# Patient Record
Sex: Female | Born: 1965 | Race: Black or African American | Hispanic: No | Marital: Married | Smoking: Never smoker
Health system: Southern US, Community
[De-identification: ages and names within clinical notes are randomized; demographics above are authoritative.]

## PROBLEM LIST (undated history)

## (undated) DIAGNOSIS — E079 Disorder of thyroid, unspecified: Secondary | ICD-10-CM

## (undated) DIAGNOSIS — K8010 Calculus of gallbladder with chronic cholecystitis without obstruction: Secondary | ICD-10-CM

## (undated) DIAGNOSIS — R112 Nausea with vomiting, unspecified: Secondary | ICD-10-CM

## (undated) DIAGNOSIS — J069 Acute upper respiratory infection, unspecified: Secondary | ICD-10-CM

## (undated) DIAGNOSIS — K760 Fatty (change of) liver, not elsewhere classified: Secondary | ICD-10-CM

## (undated) DIAGNOSIS — G4730 Sleep apnea, unspecified: Secondary | ICD-10-CM

## (undated) DIAGNOSIS — I499 Cardiac arrhythmia, unspecified: Secondary | ICD-10-CM

## (undated) DIAGNOSIS — Z98891 History of uterine scar from previous surgery: Secondary | ICD-10-CM

## (undated) DIAGNOSIS — O99280 Endocrine, nutritional and metabolic diseases complicating pregnancy, unspecified trimester: Secondary | ICD-10-CM

## (undated) DIAGNOSIS — O139 Gestational [pregnancy-induced] hypertension without significant proteinuria, unspecified trimester: Secondary | ICD-10-CM

## (undated) DIAGNOSIS — R011 Cardiac murmur, unspecified: Secondary | ICD-10-CM

## (undated) DIAGNOSIS — O24419 Gestational diabetes mellitus in pregnancy, unspecified control: Secondary | ICD-10-CM

## (undated) DIAGNOSIS — E039 Hypothyroidism, unspecified: Secondary | ICD-10-CM

## (undated) DIAGNOSIS — Z9889 Other specified postprocedural states: Secondary | ICD-10-CM

## (undated) DIAGNOSIS — O1490 Unspecified pre-eclampsia, unspecified trimester: Secondary | ICD-10-CM

## (undated) DIAGNOSIS — O09519 Supervision of elderly primigravida, unspecified trimester: Secondary | ICD-10-CM

## (undated) HISTORY — PX: FRACTURE SURGERY: SHX138

## (undated) HISTORY — DX: Disorder of thyroid, unspecified: E07.9

## (undated) HISTORY — PX: BREAST SURGERY: SHX581

## (undated) HISTORY — PX: BREAST EXCISIONAL BIOPSY: SUR124

---

## 1998-05-31 ENCOUNTER — Other Ambulatory Visit: Admission: RE | Admit: 1998-05-31 | Discharge: 1998-05-31 | Payer: Self-pay | Admitting: Internal Medicine

## 1998-09-17 ENCOUNTER — Other Ambulatory Visit: Admission: RE | Admit: 1998-09-17 | Discharge: 1998-09-17 | Payer: Self-pay | Admitting: Obstetrics and Gynecology

## 1999-06-04 ENCOUNTER — Other Ambulatory Visit: Admission: RE | Admit: 1999-06-04 | Discharge: 1999-06-04 | Payer: Self-pay | Admitting: Obstetrics and Gynecology

## 2001-04-14 HISTORY — PX: REDUCTION MAMMAPLASTY: SUR839

## 2003-04-15 HISTORY — PX: OOPHORECTOMY: SHX86

## 2004-01-18 ENCOUNTER — Inpatient Hospital Stay (HOSPITAL_COMMUNITY): Admission: RE | Admit: 2004-01-18 | Discharge: 2004-01-20 | Payer: Self-pay | Admitting: Obstetrics and Gynecology

## 2004-01-18 ENCOUNTER — Encounter (INDEPENDENT_AMBULATORY_CARE_PROVIDER_SITE_OTHER): Payer: Self-pay | Admitting: Specialist

## 2004-01-18 ENCOUNTER — Encounter (INDEPENDENT_AMBULATORY_CARE_PROVIDER_SITE_OTHER): Payer: Self-pay | Admitting: *Deleted

## 2004-04-08 ENCOUNTER — Emergency Department (HOSPITAL_COMMUNITY): Admission: EM | Admit: 2004-04-08 | Discharge: 2004-04-08 | Payer: Self-pay | Admitting: Family Medicine

## 2007-10-05 ENCOUNTER — Encounter: Admission: RE | Admit: 2007-10-05 | Discharge: 2007-10-05 | Payer: Self-pay | Admitting: Obstetrics and Gynecology

## 2010-04-18 ENCOUNTER — Encounter
Admission: RE | Admit: 2010-04-18 | Discharge: 2010-04-18 | Payer: Self-pay | Source: Home / Self Care | Attending: Obstetrics and Gynecology | Admitting: Obstetrics and Gynecology

## 2010-05-04 ENCOUNTER — Encounter: Payer: Self-pay | Admitting: Family Medicine

## 2010-07-12 ENCOUNTER — Ambulatory Visit: Payer: BC Managed Care – PPO | Attending: Specialist | Admitting: Physical Therapy

## 2010-08-30 NOTE — Discharge Summary (Signed)
[name], [name]                 ACCOUNT NO.:  [id number]   MEDICAL RECORD NO.:  [id number]          PATIENT TYPE:  INP   LOCATION:  9315                          FACILITY:  [facility]   PHYSICIAN:  Maxie Better, M.D.DATE OF BIRTH:  1965/05/10   DATE OF ADMISSION:  01/18/2004  DATE OF DISCHARGE:  01/20/2004                                 DISCHARGE SUMMARY   ADMISSION DIAGNOSIS:  Left ovarian mass.   DISCHARGE DIAGNOSIS:  Bilateral dermoid cysts.   PROCEDURE:  Exploratory laparotomy, peritoneal washings, left salpingo-  oophorectomy with frozen section, right ovarian cystectomy.   HISTORY OF PRESENT ILLNESS:  This is a 45 year old G0 married black female  with a 4.1 x 1.6 x 2.2 cm mass contained within the left ovary who presented  for surgical management.   HOSPITAL COURSE:  The patient was admitted to [facility].  She was  taken to the operating room with a general oncologist.  The above procedures  were performed.  Findings at the time of surgery was small uterus, 7 cm  multicystic left adnexal mass with some adhesions to the left pelvic side  wall, a right ovary with 3 cm dermoid cyst and normal retrocecal appendix,  normal liver edge, normal right tube.  The left tube was elongated over the  mass.  The peritoneal washings revealed no malignant cells.  The frozen  section of the left tube and ovary was consistent with a mature cystic  teratoma.  The right ovarian cyst was removed resulting in a mature cystic  teratoma being removed.  Postoperatively, the patient did well.  Her bowels  were slow to recover.  Once she passed flatus, she was to be discharged  home.  Her CBC on postoperative day #1 showed a hemoglobin of 12.8, hematocrit of  37.9, white count of 13.7.  Her incision which is a vertical incision had  staples without any erythema, induration or exudate.  She was tolerating a  regular diet and with flatus.  The patient was discharged home.   DISPOSITION:   Home.   DISCHARGE CONDITION:  Stable.   DISCHARGE INSTRUCTIONS:  Discharge instructions were given to the patient as  follows:  Call for temperature greater than or equal to 100.4.  Nothing per  vagina for 4-6 weeks.  No heavy lifting or driving for two weeks.  Call or  severe abdominal pain, nausea or vomiting, incision or drainage, redness or  increased incisional pain.   FOLLOWUP:  Discharge followup will be staple removal in the office on  January 26, 2004.  Followup otherwise in 4-6 weeks postoperatively.   DISCHARGE MEDICATIONS:  1.  Tylox #30, one to two tablets p.o. q.3-4h. p.r.n. pain.  2.  Motrin 800 mg p.o. q.6-8h. p.r.n. pain.  3.  Continuous use of her Ortho Tri-Cyclen.  4.  Multivitamins.      [place]/MEDQ  D:  02/04/2004  T:  02/05/2004  Job:  [id number]

## 2010-08-30 NOTE — Discharge Summary (Signed)
[name], [name]                 ACCOUNT NO.:  [id number]   MEDICAL RECORD NO.:  [id number]          PATIENT TYPE:  INP   LOCATION:  9315                          [facility]ILITY:  [facility]   PHYSICIAN:  Maxie Better, M.D.DATE OF BIRTH:  1966-01-24   DATE OF ADMISSION:  01/18/2004  DATE OF DISCHARGE:  01/20/2004                                 DISCHARGE SUMMARY   Audio too short to transcribe (less than 5 seconds)      [place]/MEDQ  D:  02/04/2004  T:  02/04/2004  Job:  [id number]

## 2010-08-30 NOTE — H&P (Signed)
[name], CASCIANO                 ACCOUNT NO.:  [id number]   MEDICAL RECORD NO.:  [id number]          PATIENT TYPE:  INP   LOCATION:  NA                            [facility]   PHYSICIAN:  Maxie Better, M.D.DATE OF BIRTH:  1965/11/23   DATE OF ADMISSION:  01/18/2004  DATE OF DISCHARGE:                                HISTORY & PHYSICAL   CHIEF COMPLAINT:  Left ovarian mass.   HISTORY OF PRESENT ILLNESS:  [age]-year-old G0, married black female,  LMP December 28, 2003, with a left ovarian mass, now being admitted for  exploratory laparotomy, left salpingo-oophorectomy with pelvic washings and  staging if indicated.  Patient presented for her routine annual exam in July  2005.  On examination, she was found to have an enlarged irregular uterus.  The patient returned on January 02, 2004 for an ultrasound due to the  abnormal findings on annual examination.  The ultrasound revealed a uterus  that measured 6.2 x 3.9 x 3.3 cm, a 0.3-cm endometrial stripe, a left ovary  with a diffuse appearance and papillary protrusions, the protrusions  measuring 4.1 cm and the ovary measures 6.5 x 4.4 x 7.2 cm; the right ovary  was normal.  The patient, in retrospect, reports having some vague symptoms  of some left-sided pain over the past 3 years.  She reported having a normal  ultrasound in the past in [place]; the results are not known to me.  The  patient otherwise has normal bowel movements.  She has no family history of  ovarian cancer.  She has no associated nausea or vomiting.  The patient is  on birth control pills.  She does not desire children.   PAST MEDICAL HISTORY:  Medical history is negative.   ALLERGIES:  PENICILLIN, ASPIRIN.   MEDICATIONS:  Ortho Tri-Cyclen.   SURGICAL HISTORY:  1.  Breast reduction in 2004.  2.  Wisdom tooth extraction.   GYNECOLOGICAL HISTORY:  Negative.   FAMILY HISTORY:  Maternal grandmother with colon cancer.  Maternal aunt --  breast  cancer.  Father -- MI.  Mother -- diabetes.  No ovarian cancer.   SOCIAL HISTORY:  Married, homemaker, nonsmoker.   REVIEW OF SYSTEMS:  Review of systems positive for fibroadenoma on  mammogram, August 2003, otherwise negative review of systems for all other  areas.   PHYSICAL EXAM:  GENERAL:  Tearful/anxious black female, well-developed, well-  nourished.  VITAL SIGNS:  Blood pressure 124/72, weight 181 pounds, height 5 foot 1  inch.  SKIN:  Skin shows no lesions.  HEENT:  Anicteric sclerae.  Pink conjunctivae.  Oropharynx negative.  NECK:  Neck was supple.  Thyroid not palpable.  NODES:  No supraclavicular, axillary or inguinal nodes palpable.  LUNGS:  Lungs were clear to auscultation.  HEART:  Regular rate and rhythm.  Grade 1/6 systolic ejection murmur.  BACK:  No CVA tenderness.  BREASTS:  Bilateral scars, status post breast reductions, no palpable mass.  ABDOMEN:  Abdomen slightly obese, soft.  No organomegaly.  No palpable mass.  PELVIC EXAM:  Vulva showed no lesions.  Vagina had no discharge.  Cervix  showed no lesion.  Uterus was irregular with a mass contiguous to the left  aspect of the uterus, about 8 cm, non-mobile.  Right adnexa:  No palpable  mass.  EXTREMITIES:  No calf tenderness, no edema.  RECTAL:  Exam deferred.   IMPRESSION:  Left ovarian mass.   PLAN:  Admission, exploratory laparotomy, left salpingo-oophorectomy with  frozen section, possible total abdominal hysterectomy and bilateral salpingo-  oophorectomy, based on frozen section, appropriate surgical staging, based  on the pathological findings, omentectomy, pelvic lymph node dissection if  indicated, antibiotic prophylaxis, antiembolic stockings, routine labs and  tumor markers ordered, full bowel prep on January 17, 2004, procedure  explained in detail.  Discussed staging and grading of tumors.  Patient  asked regarding if tumor is stage I, grade 1, if the finding of ovarian  tumor is noted, whether  or not she would still want to proceed with a total  hysterectomy and bilateral salpingo-oophorectomy, or allow for conservative  management if within the confines of staging and appropriate; the patient  wants to proceed with a total abdominal hysterectomy and bilateral salpingo-  oophorectomy in that setting.  The patient is aware if benign frozen  section, a left salpingo-oophorectomy would be what would be done.  Patient  also advised that sometimes frozen section may differ from the final  pathology; additional surgery may therefore be subsequently necessary.  Additional risk of surgery including infection, bleeding, injury to  surrounding organ structures such as the bladder, bowel or ureter; possible  need for blood transfusion, risks of blood transfusion including HIV  transmission, acute reaction, hepatitis transmission discussed; fistula  formation; bowel obstruction in the future; pelvic pain due to internal scar  tissue discussed; if removal of both ovaries is done, absence of a period,  inability to conceive, menopausal diagnosis and its sequences reviewed with  the patient.  Postoperative management, criteria for discharge home were  discussed.  Use of estrogen replacement therapy discussed with the patient.  All questions answered.  The patient was preoperatively seen by Dr. Pershing Cox in the event that staging for an ovarian neoplasm would be  necessary.      [place]/MEDQ  D:  01/17/2004  T:  01/17/2004  Job:  [id number]

## 2010-08-30 NOTE — Op Note (Signed)
[name], [name]                 ACCOUNT NO.:  [id number]   MEDICAL RECORD NO.:  [id number]          PATIENT TYPE:  INP   LOCATION:  9315                          [facility]   PHYSICIAN:  Maxie Better, M.D.DATE OF BIRTH:  [birth year]   DATE OF PROCEDURE:  01/18/2004  DATE OF DISCHARGE:                                 OPERATIVE REPORT   PREOPERATIVE DIAGNOSES:  Left ovarian mass.   POSTOPERATIVE DIAGNOSES:  Bilateral dermoid cyst pending final pathology.   PROCEDURE:  Examination under anesthesia, exploratory laparotomy, peritoneal  washings, left salpingo-oophorectomy with frozen section, right ovarian  cystectomy.   ANESTHESIA:  General.   SURGEON:  Maxie Better, M.D.   ASSISTANT:  Pershing Cox, M.D.   INDICATIONS FOR PROCEDURE:  This is a 45 year old, G0, married black female  on birth control pills last menstrual period of December 28, 2003 who was  found to have a left ovarian mass on ultrasound done on January 02, 2004.  The mass had papillary projections measuring 4.1 x 1.6 x 2.2 cm with a  diffuse appearance. The right ovary by ultrasound had been normal. The  patient had some vague symptoms of left lower quadrant over the last several  years.  The risks and benefits of the procedure have been explained to the  patient, please see the dictated H&P for the specifics.   DESCRIPTION OF PROCEDURE:  Under adequate general anesthesia, the patient  was placed in the supine position. Examination under anesthesia revealed a  left adnexal mass adherent to the lateral aspect of the uterus. The patient  was sterilely prepped and draped in the usual fashion for a hysterectomy. An  indwelling Foley catheter was placed. A vertical skin incision was made  infraumbilical, carried down to the rectus fascia. The rectus fascia was  split in the midline, extended up superiorly and inferiorly. The rectus  muscles split in the midline, the parietoperitoneum was  entered sharply and  extended.  500 mL of heparinized fluid was infused in the abdomen and  peritoneal washings were then obtained.  The parietoperitoneum was entered  further, the abdomen was then explored, the upper liver edge was palpably  normal, kidneys appeared normal by palpation.  The subdiaphragmatic area was  palpated, no abnormalities noted. The bowels were packed upwardly, the  uterus was noted to be very small. There was a left adnexal mass extended  anteriorly into the broad ligament inferiorly and attached partially to the  left pelvic sidewall. The right ovary  had about a 3 cm cystic mass, the  right tube was normal. The appendix appeared retrocecal but otherwise  normal.  A self retaining Balfour retractor was placed. The left  retroperitoneal space was opened, the left ureter was noted and visualized  with peristalsis seen. The left ovarian vessels were isolated, doubly  clamped, cut and ligated with #0 Vicryl proximally x2 and distally x1.  The  left round ligament suture ligated with #0 Vicryl x2 and severed.  The  anterior leaf of the broad ligament was then opened in order to help  facilitate the removal  of the cystic mass that extended into that area. The  left uteroovarian ligament was clamped, doubly cut with careful dissection.  The left adnexal mass was then subsequently removed in total including the  left tube. This was sent for frozen section.  The left aspect of the uterus  was then suture ligated with #0 Vicryl x2 for good hemostasis.  Small  bleeding in the pelvis on the left was carefully cauterized. Attention was  then turned to the right ovary where the right tube was hyperextended over  the cystic mass.  Using needle point cautery, the antimesenteric border of  the ovary was opened.  Blunt and sharp dissection was then utilized to  enucleate the cyst. The incidental rupture occurred which was consistent  with sebaceous fluid. The cyst was then removed,  the bleeders sutured with 3-  0 Vicryl and then cauterized as well. The remaining portion of the ovarian  tissue was then approximated using 3-0 Vicryl suture.  Good hemostasis was  subsequently noted.  During the cystectomy, the frozen section revealed a  mature teratoma on the left. The procedure was then felt to have been  adequate. The abdomen was then copiously irrigated, suctioned, small  bleeders cauterized. All packings were then subsequently removed. The  Balfour retractor was then removed. The parietoperitoneum was now closed.  The rectus fascia was closed with #0 PDS double loop stitch proximally and  distally. The subcutaneous area was irrigated, small bleeders cauterized and  the skin approximated using Ethibond staples. The specimen was left tube and  ovary and the right ovarian cyst, peritoneal washings.  Estimated blood loss  was 150-200 mL.  Urine output was 200 mL clear yellow urine. Intraoperative  fluid was 2100 mL crystalloid. Sponge and instrument counts x2 was correct.  Complication was none. The patient tolerated the procedure well and was  transferred to recovery in stable condition.      [place]/MEDQ  D:  01/18/2004  T:  01/18/2004  Job:  [id number]

## 2011-05-22 ENCOUNTER — Other Ambulatory Visit: Payer: Self-pay | Admitting: Obstetrics and Gynecology

## 2011-05-22 DIAGNOSIS — Z1231 Encounter for screening mammogram for malignant neoplasm of breast: Secondary | ICD-10-CM

## 2011-06-11 ENCOUNTER — Ambulatory Visit
Admission: RE | Admit: 2011-06-11 | Discharge: 2011-06-11 | Disposition: A | Discharge status: Home / Self Care | Payer: BC Managed Care – PPO | Source: Ambulatory Visit | Attending: Obstetrics and Gynecology | Admitting: Obstetrics and Gynecology

## 2011-06-11 ENCOUNTER — Other Ambulatory Visit: Payer: Self-pay | Admitting: Obstetrics and Gynecology

## 2011-06-11 DIAGNOSIS — Z1231 Encounter for screening mammogram for malignant neoplasm of breast: Secondary | ICD-10-CM

## 2011-06-11 DIAGNOSIS — N63 Unspecified lump in unspecified breast: Secondary | ICD-10-CM

## 2011-06-16 ENCOUNTER — Ambulatory Visit
Admission: RE | Admit: 2011-06-16 | Discharge: 2011-06-16 | Disposition: A | Discharge status: Home / Self Care | Payer: BC Managed Care – PPO | Source: Ambulatory Visit | Attending: Obstetrics and Gynecology | Admitting: Obstetrics and Gynecology

## 2011-06-16 DIAGNOSIS — N63 Unspecified lump in unspecified breast: Secondary | ICD-10-CM

## 2011-09-15 ENCOUNTER — Other Ambulatory Visit: Payer: Self-pay | Admitting: Physician Assistant

## 2011-09-15 DIAGNOSIS — E039 Hypothyroidism, unspecified: Secondary | ICD-10-CM

## 2011-09-17 ENCOUNTER — Ambulatory Visit
Admission: RE | Admit: 2011-09-17 | Discharge: 2011-09-17 | Disposition: A | Discharge status: Home / Self Care | Payer: BC Managed Care – PPO | Source: Ambulatory Visit | Attending: Physician Assistant | Admitting: Physician Assistant

## 2011-09-17 DIAGNOSIS — E039 Hypothyroidism, unspecified: Secondary | ICD-10-CM

## 2012-01-29 LAB — OB RESULTS CONSOLE RPR: RPR: NONREACTIVE

## 2012-01-29 LAB — OB RESULTS CONSOLE HEPATITIS B SURFACE ANTIGEN: Hepatitis B Surface Ag: NEGATIVE

## 2012-01-29 LAB — OB RESULTS CONSOLE HIV ANTIBODY (ROUTINE TESTING): HIV: NONREACTIVE

## 2012-01-29 LAB — OB RESULTS CONSOLE GC/CHLAMYDIA: Gonorrhea: NEGATIVE

## 2012-06-16 ENCOUNTER — Encounter: Payer: BC Managed Care – PPO | Attending: Obstetrics and Gynecology | Admitting: *Deleted

## 2012-06-16 ENCOUNTER — Encounter: Payer: Self-pay | Admitting: *Deleted

## 2012-06-16 DIAGNOSIS — O99810 Abnormal glucose complicating pregnancy: Secondary | ICD-10-CM | POA: Insufficient documentation

## 2012-06-16 DIAGNOSIS — Z713 Dietary counseling and surveillance: Secondary | ICD-10-CM | POA: Insufficient documentation

## 2012-06-16 NOTE — Progress Notes (Signed)
  Patient was seen on 06/16/2012 for Gestational Diabetes self-management class at the [facility]. The following learning objectives were met by the patient during this course:   States the definition of Gestational Diabetes  States why dietary management is important in controlling blood glucose  Describes the effects each nutrient has on blood glucose levels  Demonstrates ability to create a balanced meal plan  Demonstrates carbohydrate counting   States when to check blood glucose levels  Demonstrates proper blood glucose monitoring techniques  States the effect of stress and exercise on blood glucose levels  States the importance of limiting caffeine and abstaining from alcohol and smoking  Blood glucose monitor given: Accu Chek Nano BG Monitoring Kit Lot # [id number] Exp: 08/11/13 Blood glucose reading: 98 mg/dl  Patient instructed to monitor glucose levels: FBS: 60 - <90 2 hour: <120  *Patient received handouts:  Nutrition Diabetes and Pregnancy  Carbohydrate Counting List  Patient will be seen for follow-up as needed.

## 2012-06-16 NOTE — Patient Instructions (Signed)
Goals:  Check glucose levels per MD as instructed  Follow Gestational Diabetes Diet as instructed  Call for follow-up as needed    

## 2012-08-24 ENCOUNTER — Encounter (HOSPITAL_COMMUNITY): Payer: Self-pay | Admitting: *Deleted

## 2012-08-24 ENCOUNTER — Inpatient Hospital Stay (HOSPITAL_COMMUNITY)
Admission: AD | Admit: 2012-08-24 | Discharge: 2012-08-29 | DRG: 651 | Disposition: A | Discharge status: Home / Self Care | Payer: BC Managed Care – PPO | Source: Ambulatory Visit | Attending: Obstetrics and Gynecology | Admitting: Obstetrics and Gynecology

## 2012-08-24 DIAGNOSIS — E669 Obesity, unspecified: Secondary | ICD-10-CM | POA: Diagnosis present

## 2012-08-24 DIAGNOSIS — Z22330 Carrier of Group B streptococcus: Secondary | ICD-10-CM

## 2012-08-24 DIAGNOSIS — O1490 Unspecified pre-eclampsia, unspecified trimester: Secondary | ICD-10-CM

## 2012-08-24 DIAGNOSIS — O99814 Abnormal glucose complicating childbirth: Secondary | ICD-10-CM | POA: Diagnosis present

## 2012-08-24 DIAGNOSIS — O24419 Gestational diabetes mellitus in pregnancy, unspecified control: Secondary | ICD-10-CM

## 2012-08-24 DIAGNOSIS — O99892 Other specified diseases and conditions complicating childbirth: Secondary | ICD-10-CM | POA: Diagnosis present

## 2012-08-24 DIAGNOSIS — O1493 Unspecified pre-eclampsia, third trimester: Secondary | ICD-10-CM

## 2012-08-24 DIAGNOSIS — IMO0002 Reserved for concepts with insufficient information to code with codable children: Principal | ICD-10-CM | POA: Diagnosis present

## 2012-08-24 DIAGNOSIS — E039 Hypothyroidism, unspecified: Secondary | ICD-10-CM

## 2012-08-24 DIAGNOSIS — Z98891 History of uterine scar from previous surgery: Secondary | ICD-10-CM

## 2012-08-24 DIAGNOSIS — O09519 Supervision of elderly primigravida, unspecified trimester: Secondary | ICD-10-CM

## 2012-08-24 DIAGNOSIS — O324XX0 Maternal care for high head at term, not applicable or unspecified: Secondary | ICD-10-CM | POA: Diagnosis present

## 2012-08-24 DIAGNOSIS — E079 Disorder of thyroid, unspecified: Secondary | ICD-10-CM | POA: Diagnosis present

## 2012-08-24 HISTORY — DX: Hypothyroidism, unspecified: E03.9

## 2012-08-24 HISTORY — DX: Nausea with vomiting, unspecified: R11.2

## 2012-08-24 HISTORY — DX: Cardiac arrhythmia, unspecified: I49.9

## 2012-08-24 HISTORY — DX: Acute upper respiratory infection, unspecified: J06.9

## 2012-08-24 HISTORY — DX: Unspecified pre-eclampsia, unspecified trimester: O14.90

## 2012-08-24 HISTORY — DX: Supervision of elderly primigravida, unspecified trimester: O09.519

## 2012-08-24 HISTORY — DX: Gestational diabetes mellitus in pregnancy, unspecified control: O24.419

## 2012-08-24 HISTORY — DX: Sleep apnea, unspecified: G47.30

## 2012-08-24 HISTORY — DX: Gestational (pregnancy-induced) hypertension without significant proteinuria, unspecified trimester: O13.9

## 2012-08-24 HISTORY — DX: Other specified postprocedural states: Z98.890

## 2012-08-24 HISTORY — DX: History of uterine scar from previous surgery: Z98.891

## 2012-08-24 HISTORY — DX: Endocrine, nutritional and metabolic diseases complicating pregnancy, unspecified trimester: O99.280

## 2012-08-24 HISTORY — DX: Morbid (severe) obesity due to excess calories: E66.01

## 2012-08-24 HISTORY — DX: Cardiac murmur, unspecified: R01.1

## 2012-08-24 LAB — COMPREHENSIVE METABOLIC PANEL
ALT: 14 U/L (ref 0–35)
AST: 20 U/L (ref 0–37)
Albumin: 2.6 g/dL — ABNORMAL LOW (ref 3.5–5.2)
BUN: 3 mg/dL — ABNORMAL LOW (ref 6–23)
BUN: <3 mg/dL — ABNORMAL LOW (ref 6–23)
CO2: 22 mEq/L (ref 19–32)
Calcium: 8.8 mg/dL (ref 8.4–10.5)
Calcium: 8.3 mg/dL — ABNORMAL LOW (ref 8.4–10.5)
Chloride: 103 mEq/L (ref 96–112)
Creatinine, Ser: 0.57 mg/dL (ref 0.50–1.10)
Creatinine, Ser: 0.54 mg/dL (ref 0.50–1.10)
GFR calc Af Amer: >90 mL/min (ref >90)
GFR calc non Af Amer: >90 mL/min (ref >90)
GFR calc non Af Amer: >90 mL/min (ref >90)
Glucose, Bld: 77 mg/dL (ref 70–99)
Total Bilirubin: 0.4 mg/dL (ref 0.3–1.2)

## 2012-08-24 LAB — CBC
HCT: 35.8 % — ABNORMAL LOW (ref 36.0–46.0)
HCT: 35.9 % — ABNORMAL LOW (ref 36.0–46.0)
Hemoglobin: 11.9 g/dL — ABNORMAL LOW (ref 12.0–15.0)
MCH: 27.5 pg (ref 26.0–34.0)
MCH: 27.9 pg (ref 26.0–34.0)
MCHC: 33.2 g/dL (ref 30.0–36.0)
MCHC: 34 g/dL (ref 30.0–36.0)
MCV: 82.9 fL (ref 78.0–100.0)
MCV: 82.2 fL (ref 78.0–100.0)
Platelets: 227 10*3/uL (ref 150–400)
RDW: 15.6 % — ABNORMAL HIGH (ref 11.5–15.5)
RDW: 15.6 % — ABNORMAL HIGH (ref 11.5–15.5)
WBC: 10 10*3/uL (ref 4.0–10.5)

## 2012-08-24 LAB — GLUCOSE, CAPILLARY
Glucose-Capillary: 132 mg/dL — ABNORMAL HIGH (ref 70–99)
Glucose-Capillary: 99 mg/dL (ref 70–99)

## 2012-08-24 LAB — URIC ACID
Uric Acid, Serum: 4.3 mg/dL (ref 2.4–7.0)
Uric Acid, Serum: 4.3 mg/dL (ref 2.4–7.0)

## 2012-08-24 LAB — MAGNESIUM: Magnesium: 3.4 mg/dL — ABNORMAL HIGH (ref 1.5–2.5)

## 2012-08-24 MED ORDER — EPHEDRINE 5 MG/ML INJ
10.0000 mg | INTRAVENOUS | Status: DC | PRN
  Filled 2012-08-24: qty 4

## 2012-08-24 MED ORDER — TERBUTALINE SULFATE 1 MG/ML IJ SOLN: 0.2500 mg | Freq: Once | INTRAMUSCULAR | Status: AC | PRN

## 2012-08-24 MED ORDER — LABETALOL HCL 100 MG PO TABS
100.0000 mg | ORAL_TABLET | Freq: Two times a day (BID) | ORAL | Status: DC
  Administered 2012-08-24 – 2012-08-26 (×4): 100 mg via ORAL
  Filled 2012-08-24 (×7): qty 1

## 2012-08-24 MED ORDER — IBUPROFEN 600 MG PO TABS: 600.0000 mg | ORAL_TABLET | Freq: Four times a day (QID) | ORAL | Status: DC | PRN

## 2012-08-24 MED ORDER — PHENYLEPHRINE 40 MCG/ML (10ML) SYRINGE FOR IV PUSH (FOR BLOOD PRESSURE SUPPORT)
80.0000 ug | PREFILLED_SYRINGE | INTRAVENOUS | Status: DC | PRN
  Administered 2012-08-24 (×2): 80 ug via INTRAVENOUS

## 2012-08-24 MED ORDER — OXYCODONE-ACETAMINOPHEN 5-325 MG PO TABS: 1.0000 | ORAL_TABLET | ORAL | Status: DC | PRN

## 2012-08-24 MED ORDER — MAGNESIUM SULFATE BOLUS VIA INFUSION
4.0000 g | Freq: Once | INTRAVENOUS | Status: AC
  Administered 2012-08-24: 4 g via INTRAVENOUS
  Filled 2012-08-24: qty 500

## 2012-08-24 MED ORDER — LIDOCAINE HCL (PF) 1 % IJ SOLN: 30.0000 mL | INTRAMUSCULAR | Status: DC | PRN

## 2012-08-24 MED ORDER — MAGNESIUM SULFATE 40 MG/ML IJ SOLN: 2.0000 g | Freq: Once | INTRAMUSCULAR | Status: DC

## 2012-08-24 MED ORDER — FLEET ENEMA 7-19 GM/118ML RE ENEM: 1.0000 | ENEMA | RECTAL | Status: DC | PRN

## 2012-08-24 MED ORDER — EPHEDRINE 5 MG/ML INJ: 10.0000 mg | INTRAVENOUS | Status: DC | PRN

## 2012-08-24 MED ORDER — LACTATED RINGERS IV SOLN
500.0000 mL | Freq: Once | INTRAVENOUS | Status: AC
  Administered 2012-08-24: 200 mL via INTRAVENOUS

## 2012-08-24 MED ORDER — MAGNESIUM SULFATE BOLUS VIA INFUSION
2.0000 g | Freq: Once | INTRAVENOUS | Status: AC
  Administered 2012-08-24: 2 g via INTRAVENOUS
  Filled 2012-08-24: qty 500

## 2012-08-24 MED ORDER — LACTATED RINGERS IV SOLN
500.0000 mL | INTRAVENOUS | Status: DC | PRN
  Administered 2012-08-24: 250 mL via INTRAVENOUS

## 2012-08-24 MED ORDER — LABETALOL HCL 5 MG/ML IV SOLN
20.0000 mg | Freq: Once | INTRAVENOUS | Status: AC
  Administered 2012-08-24: 20 mg via INTRAVENOUS
  Filled 2012-08-24: qty 4

## 2012-08-24 MED ORDER — FENTANYL 2.5 MCG/ML BUPIVACAINE 1/10 % EPIDURAL INFUSION (WH - ANES)
14.0000 mL/h | INTRAMUSCULAR | Status: DC | PRN
  Administered 2012-08-24: 14 mL/h via EPIDURAL
  Filled 2012-08-24: qty 125

## 2012-08-24 MED ORDER — VANCOMYCIN HCL IN DEXTROSE 1-5 GM/200ML-% IV SOLN
1000.0000 mg | Freq: Two times a day (BID) | INTRAVENOUS | Status: DC
  Administered 2012-08-24 – 2012-08-25 (×2): 1000 mg via INTRAVENOUS
  Filled 2012-08-24 (×3): qty 200

## 2012-08-24 MED ORDER — PHENYLEPHRINE 40 MCG/ML (10ML) SYRINGE FOR IV PUSH (FOR BLOOD PRESSURE SUPPORT)
80.0000 ug | PREFILLED_SYRINGE | INTRAVENOUS | Status: DC | PRN
  Filled 2012-08-24 (×2): qty 5

## 2012-08-24 MED ORDER — DIPHENHYDRAMINE HCL 50 MG/ML IJ SOLN: 12.5000 mg | INTRAMUSCULAR | Status: DC | PRN

## 2012-08-24 MED ORDER — LACTATED RINGERS IV SOLN
INTRAVENOUS | Status: DC
  Administered 2012-08-25 (×2): via INTRAVENOUS

## 2012-08-24 MED ORDER — OXYTOCIN BOLUS FROM INFUSION: 500.0000 mL | INTRAVENOUS | Status: DC

## 2012-08-24 MED ORDER — FLUTICASONE PROPIONATE 50 MCG/ACT NA SUSP
2.0000 | Freq: Every day | NASAL | Status: DC
  Administered 2012-08-24 – 2012-08-29 (×5): 2 via NASAL
  Filled 2012-08-24: qty 16

## 2012-08-24 MED ORDER — CITRIC ACID-SODIUM CITRATE 334-500 MG/5ML PO SOLN
30.0000 mL | ORAL | Status: DC | PRN
  Administered 2012-08-25: 30 mL via ORAL
  Filled 2012-08-24 (×2): qty 15

## 2012-08-24 MED ORDER — OXYTOCIN 40 UNITS IN LACTATED RINGERS INFUSION - SIMPLE MED
1.0000 m[IU]/min | INTRAVENOUS | Status: DC
  Administered 2012-08-24: 2 m[IU]/min via INTRAVENOUS
  Filled 2012-08-24: qty 1000

## 2012-08-24 MED ORDER — LIDOCAINE HCL (PF) 1 % IJ SOLN
INTRAMUSCULAR | Status: DC | PRN
  Administered 2012-08-24 (×4): 4 mL

## 2012-08-24 MED ORDER — ONDANSETRON HCL 4 MG/2ML IJ SOLN
4.0000 mg | Freq: Four times a day (QID) | INTRAMUSCULAR | Status: DC | PRN
  Administered 2012-08-25: 4 mg via INTRAVENOUS
  Filled 2012-08-24: qty 2

## 2012-08-24 MED ORDER — OXYTOCIN 40 UNITS IN LACTATED RINGERS INFUSION - SIMPLE MED: 62.5000 mL/h | INTRAVENOUS | Status: DC

## 2012-08-24 MED ORDER — LACTATED RINGERS IV SOLN
INTRAVENOUS | Status: DC
  Administered 2012-08-24 – 2012-08-25 (×3): via INTRAVENOUS

## 2012-08-24 MED ORDER — ACETAMINOPHEN 325 MG PO TABS
650.0000 mg | ORAL_TABLET | ORAL | Status: DC | PRN
  Administered 2012-08-25: 650 mg via ORAL
  Filled 2012-08-24: qty 2

## 2012-08-24 MED ORDER — MAGNESIUM SULFATE 40 G IN LACTATED RINGERS - SIMPLE
2.0000 g/h | INTRAVENOUS | Status: DC
  Filled 2012-08-24: qty 500

## 2012-08-24 NOTE — Anesthesia Procedure Notes (Signed)
Epidural Patient location during procedure: OB Start time: 08/24/2012 9:50 PM  Staffing Performed by: anesthesiologist   Preanesthetic Checklist Completed: patient identified, site marked, surgical consent, pre-op evaluation, timeout performed, IV checked, risks and benefits discussed and monitors and equipment checked  Epidural Patient position: right lateral decubitus Prep: site prepped and draped and DuraPrep Patient monitoring: continuous pulse ox and blood pressure Approach: midline Injection technique: LOR air  Needle:  Needle type: Tuohy  Needle gauge: 17 G Needle length: 9 cm and 9 Needle insertion depth: 7 cm Catheter type: closed end flexible Catheter size: 19 Gauge Catheter at skin depth: 12 cm Test dose: negative  Assessment Events: blood not aspirated, injection not painful, no injection resistance, negative IV test and no paresthesia  Additional Notes Discussed risk of headache, infection, bleeding, nerve injury and failed or incomplete block.  Patient voices understanding and wishes to proceed.  Dr Cherly Hensen requested epidural be placed with patient in lateral position due to high station of vertex at time of amniotomy.    Epidural placed easily on first attempt.  No paresthesia.  Patient tolerated procedure well with no apparent complications.  Jasmine December, MD   Reason for block:procedure for pain

## 2012-08-24 NOTE — Anesthesia Preprocedure Evaluation (Addendum)
Anesthesia Evaluation  Patient identified by MRN, date of birth, ID band Patient awake    Reviewed: Allergy & Precautions, H&P , NPO status , Patient's Chart, lab work & pertinent test results, reviewed documented beta blocker date and time   History of Anesthesia Complications (+) PONV  Airway Mallampati: II TM Distance: >3 FB Neck ROM: full    Dental  (+) Teeth Intact   Pulmonary sleep apnea and Continuous Positive Airway Pressure Ventilation , Recent URI  (allergies),  breath sounds clear to auscultation        Cardiovascular hypertension (PIH), Rhythm:regular Rate:Normal     Neuro/Psych negative neurological ROS  negative psych ROS   GI/Hepatic Neg liver ROS, GERD-  Medicated,  Endo/Other  diabetes, Gestational, Oral Hypoglycemic AgentsHypothyroidism Morbid obesity  Renal/GU negative Renal ROS  negative genitourinary   Musculoskeletal   Abdominal   Peds  Hematology negative hematology ROS (+)   Anesthesia Other Findings   Reproductive/Obstetrics (+) Pregnancy (failure to progress --> C/S)                          Anesthesia Physical Anesthesia Plan  ASA: III and emergent  Anesthesia Plan: Epidural   Post-op Pain Management:    Induction:   Airway Management Planned:   Additional Equipment:   Intra-op Plan:   Post-operative Plan:   Informed Consent: I have reviewed the patients History and Physical, chart, labs and discussed the procedure including the risks, benefits and alternatives for the proposed anesthesia with the patient or authorized representative who has indicated his/her understanding and acceptance.     Plan Discussed with: Surgeon and CRNA  Anesthesia Plan Comments:        Anesthesia Quick Evaluation

## 2012-08-24 NOTE — H&P (Signed)
[name] is a 47 y.o. female presenting for inductron 2nd to preeclampsia. Pt presented to office for routine [facility] and was found to have BP 188/112, 160/105. Pt notes increased leg swelling. Denies blurry vision or epigastric pain. Pt had 24 hr UTp showing 300mg  protein.  Last PIH labs nl. sono done: BPP 8/8, upper nl AFI, EFW 7lb 8 oz,  History OB History   Grav Para Term Preterm Abortions TAB SAB Ect Mult Living   1              Past Medical History  Diagnosis Date  . Diabetes mellitus without complication   . Thyroid disease   . PONV (postoperative nausea and vomiting)   . Heart murmur   . Hypothyroidism   . Pregnancy induced hypertension   . Sleep apnea   . Dysrhythmia   . Recurrent upper respiratory infection (URI)   . Gestational diabetes    Past Surgical History  Procedure Laterality Date  . Oophorectomy  2005  . Breast surgery    . Fracture surgery      rt great toe-1990   Family History: family history includes Arthritis in her father and mother; Diabetes in her mother; Hyperlipidemia in her father and mother; Hypertension in her mother; and Miscarriages / Stillbirths in her mother. Social History:  reports that she has never smoked. She does not have any smokeless tobacco history on file. She reports that she does not drink alcohol or use illicit drugs.   Prenatal Transfer Tool  Maternal Diabetes: Yes:  Diabetes Type:  Insulin/Medication controlled Genetic Screening: Normal Maternal Ultrasounds/Referrals: Normal Fetal Ultrasounds or other Referrals:  None Maternal Substance Abuse:  No Significant Maternal Medications:  Meds include: Syntroid, glyburide Significant Maternal Lab Results:  Lab values include: Group B Strep positive Other Comments:  hypothyroidism  ROS neg except HPI  Dilation: Closed Effacement (%): 40 Station: -3 Exam by:: Erline Hau RNC  Blood pressure 163/80, pulse 90, resp. rate 20, height 5' 1.5" (1.562 m), weight 106.595 kg (235  lb). Exam Physical Exam  Constitutional: She is oriented to person, place, and time.  Cardiovascular: Normal rate.   GI: Bowel sounds are normal.  Musculoskeletal: She exhibits edema.  Neurological: She is alert and oriented to person, place, and time.  Skin: Skin is warm and dry.  Psychiatric: She has a normal mood and affect.   DTR 2+ (-)clonus. (+) 2 edema Prenatal labs: ABO, Rh: --/--/A POS (05/13 1153) Antibody: NEG (05/13 1153) Rubella:  Immune RPR:   NR HBsAg:   neg HIV:   neg GBS:   Positive  Assessment/Plan: Preeclampsia Class A2 GDM Term gestation hypothyroidism AMA P) admit IV labetalol prn. Intracervical balloon. IV magnesium. PIH labs, BS check q 2 hrs. epidural prn, amniotomy prn. Cont synthroid.IV vancomycin. Pitocin    Suzanna Zahn A 08/24/2012, 3:11 PM

## 2012-08-24 NOTE — Progress Notes (Signed)
S: request epidural Denies ctx   Magnesium Infusion Vancomycin Pitocin 12 MIU  O:  BP 198/96 intracervical balloon removed VE: 1/60/-3 v tx After pt voided 350cc Controlled amniotomy done: copious clear AF noted . IUPC placed  Tracing; baseline 120  (+) variability Ctx q 2-3 mins BS: 132 Magnesium 3.4 Urine output: 350 cc  ImP; Preeclampsia on Magnesium GBS cx (+) on Vancomycin Term Class A2 GDM Hypothyroidism   P) repeat PIH labs now. Cont CBG q 2 hr.  Magnesium 2gram  bolus. Labetalol IV. Start oral labetalol. Restart synthroid in am.

## 2012-08-24 NOTE — Progress Notes (Signed)
Dr. Rodman Pickle notified of pt's decreased BP and fetal heart rate change. Orders given for 250cc bolus and 80 mcg of phenylephrine IVP.

## 2012-08-25 ENCOUNTER — Encounter (HOSPITAL_COMMUNITY): Payer: Self-pay | Admitting: *Deleted

## 2012-08-25 ENCOUNTER — Encounter (HOSPITAL_COMMUNITY): Payer: Self-pay | Admitting: Anesthesiology

## 2012-08-25 ENCOUNTER — Encounter (HOSPITAL_COMMUNITY)
Admission: AD | Disposition: A | Discharge status: Home / Self Care | Payer: Self-pay | Source: Ambulatory Visit | Attending: Obstetrics and Gynecology

## 2012-08-25 ENCOUNTER — Inpatient Hospital Stay (HOSPITAL_COMMUNITY): Payer: BC Managed Care – PPO | Admitting: Anesthesiology

## 2012-08-25 LAB — COMPREHENSIVE METABOLIC PANEL
Albumin: 2.2 g/dL — ABNORMAL LOW (ref 3.5–5.2)
Alkaline Phosphatase: 236 U/L — ABNORMAL HIGH (ref 39–117)
BUN: <3 mg/dL — ABNORMAL LOW (ref 6–23)
CO2: 21 mEq/L (ref 19–32)
Chloride: 105 mEq/L (ref 96–112)
Creatinine, Ser: 0.53 mg/dL (ref 0.50–1.10)
GFR calc Af Amer: >90 mL/min (ref >90)
GFR calc non Af Amer: >90 mL/min (ref >90)
Glucose, Bld: 230 mg/dL — ABNORMAL HIGH (ref 70–99)
Potassium: 3.3 mEq/L — ABNORMAL LOW (ref 3.5–5.1)
Total Bilirubin: 0.7 mg/dL (ref 0.3–1.2)

## 2012-08-25 LAB — CBC
HCT: 31.2 % — ABNORMAL LOW (ref 36.0–46.0)
HCT: 33.8 % — ABNORMAL LOW (ref 36.0–46.0)
Hemoglobin: 10.4 g/dL — ABNORMAL LOW (ref 12.0–15.0)
Hemoglobin: 11.3 g/dL — ABNORMAL LOW (ref 12.0–15.0)
MCV: 82.8 fL (ref 78.0–100.0)
MCV: 82.4 fL (ref 78.0–100.0)
RDW: 15.6 % — ABNORMAL HIGH (ref 11.5–15.5)
RDW: 15.5 % (ref 11.5–15.5)
WBC: 13.2 10*3/uL — ABNORMAL HIGH (ref 4.0–10.5)
WBC: 14.4 10*3/uL — ABNORMAL HIGH (ref 4.0–10.5)

## 2012-08-25 LAB — GLUCOSE, CAPILLARY
Glucose-Capillary: 101 mg/dL — ABNORMAL HIGH (ref 70–99)
Glucose-Capillary: 101 mg/dL — ABNORMAL HIGH (ref 70–99)
Glucose-Capillary: 103 mg/dL — ABNORMAL HIGH (ref 70–99)

## 2012-08-25 LAB — URIC ACID: Uric Acid, Serum: 4.7 mg/dL (ref 2.4–7.0)

## 2012-08-25 SURGERY — Surgical Case
Anesthesia: Epidural | Site: Abdomen | Wound class: Clean Contaminated

## 2012-08-25 MED ORDER — LEVOTHYROXINE SODIUM 75 MCG PO TABS
75.0000 ug | ORAL_TABLET | Freq: Every day | ORAL | Status: DC
  Administered 2012-08-25 – 2012-08-29 (×5): 75 ug via ORAL
  Filled 2012-08-25 (×5): qty 1

## 2012-08-25 MED ORDER — ZOLPIDEM TARTRATE 5 MG PO TABS: 5.0000 mg | ORAL_TABLET | Freq: Every evening | ORAL | Status: DC | PRN

## 2012-08-25 MED ORDER — CARBOPROST TROMETHAMINE 250 MCG/ML IM SOLN
INTRAMUSCULAR | Status: DC | PRN
  Administered 2012-08-25: 250 ug via INTRAMUSCULAR

## 2012-08-25 MED ORDER — PHENYLEPHRINE 40 MCG/ML (10ML) SYRINGE FOR IV PUSH (FOR BLOOD PRESSURE SUPPORT)
PREFILLED_SYRINGE | INTRAVENOUS | Status: AC
  Filled 2012-08-25: qty 5

## 2012-08-25 MED ORDER — ONDANSETRON HCL 4 MG PO TABS
4.0000 mg | ORAL_TABLET | ORAL | Status: DC | PRN
  Administered 2012-08-28: 4 mg via ORAL
  Filled 2012-08-25: qty 1

## 2012-08-25 MED ORDER — ONDANSETRON HCL 4 MG/2ML IJ SOLN: 4.0000 mg | INTRAMUSCULAR | Status: DC | PRN

## 2012-08-25 MED ORDER — BISACODYL 10 MG RE SUPP
10.0000 mg | Freq: Every day | RECTAL | Status: DC | PRN
  Administered 2012-08-27: 10 mg via RECTAL
  Filled 2012-08-25: qty 1

## 2012-08-25 MED ORDER — MAGNESIUM SULFATE 40 G IN LACTATED RINGERS - SIMPLE
2.0000 g/h | INTRAVENOUS | Status: DC
  Administered 2012-08-25 – 2012-08-26 (×2): 2 g/h via INTRAVENOUS
  Filled 2012-08-25 (×2): qty 500

## 2012-08-25 MED ORDER — PRENATAL MULTIVITAMIN CH
1.0000 | ORAL_TABLET | Freq: Every day | ORAL | Status: DC
  Administered 2012-08-26 – 2012-08-29 (×4): 1 via ORAL
  Filled 2012-08-25 (×4): qty 1

## 2012-08-25 MED ORDER — NALOXONE HCL 1 MG/ML IJ SOLN: 1.0000 ug/kg/h | INTRAVENOUS | Status: DC | PRN

## 2012-08-25 MED ORDER — BUPIVACAINE HCL (PF) 0.25 % IJ SOLN
INTRAMUSCULAR | Status: DC | PRN
  Administered 2012-08-25: 10 mL

## 2012-08-25 MED ORDER — CARBOPROST TROMETHAMINE 250 MCG/ML IM SOLN
INTRAMUSCULAR | Status: AC
  Filled 2012-08-25: qty 1

## 2012-08-25 MED ORDER — KCL-LACTATED RINGERS 20 MEQ/L IV SOLN
INTRAVENOUS | Status: DC
  Administered 2012-08-25 – 2012-08-26 (×2): via INTRAVENOUS
  Filled 2012-08-25 (×4): qty 1000

## 2012-08-25 MED ORDER — MEPERIDINE HCL 25 MG/ML IJ SOLN: 6.2500 mg | INTRAMUSCULAR | Status: DC | PRN

## 2012-08-25 MED ORDER — EPHEDRINE SULFATE 50 MG/ML IJ SOLN
INTRAMUSCULAR | Status: DC | PRN
  Administered 2012-08-25 (×2): 5 mg via INTRAVENOUS
  Administered 2012-08-25: 10 mg via INTRAVENOUS

## 2012-08-25 MED ORDER — ONDANSETRON HCL 4 MG/2ML IJ SOLN: 4.0000 mg | Freq: Three times a day (TID) | INTRAMUSCULAR | Status: DC | PRN

## 2012-08-25 MED ORDER — DIBUCAINE 1 % RE OINT: 1.0000 "application " | TOPICAL_OINTMENT | RECTAL | Status: DC | PRN

## 2012-08-25 MED ORDER — METOCLOPRAMIDE HCL 5 MG/ML IJ SOLN
INTRAMUSCULAR | Status: AC
  Filled 2012-08-25: qty 2

## 2012-08-25 MED ORDER — EPHEDRINE 5 MG/ML INJ
INTRAVENOUS | Status: AC
  Filled 2012-08-25: qty 10

## 2012-08-25 MED ORDER — SODIUM CHLORIDE 0.9 % IJ SOLN: 3.0000 mL | Freq: Two times a day (BID) | INTRAMUSCULAR | Status: DC

## 2012-08-25 MED ORDER — ONDANSETRON HCL 4 MG/2ML IJ SOLN
INTRAMUSCULAR | Status: AC
  Filled 2012-08-25: qty 2

## 2012-08-25 MED ORDER — DIPHENHYDRAMINE HCL 25 MG PO CAPS
25.0000 mg | ORAL_CAPSULE | ORAL | Status: DC | PRN
  Administered 2012-08-25 – 2012-08-26 (×2): 25 mg via ORAL
  Filled 2012-08-25 (×2): qty 1

## 2012-08-25 MED ORDER — FERROUS SULFATE 325 (65 FE) MG PO TABS
325.0000 mg | ORAL_TABLET | Freq: Two times a day (BID) | ORAL | Status: DC
  Administered 2012-08-25 – 2012-08-29 (×8): 325 mg via ORAL
  Filled 2012-08-25 (×8): qty 1

## 2012-08-25 MED ORDER — SCOPOLAMINE 1 MG/3DAYS TD PT72: 1.0000 | MEDICATED_PATCH | Freq: Once | TRANSDERMAL | Status: DC

## 2012-08-25 MED ORDER — POLYETHYLENE GLYCOL 3350 17 G PO PACK
17.0000 g | PACK | Freq: Every day | ORAL | Status: DC
  Administered 2012-08-25 – 2012-08-29 (×5): 17 g via ORAL
  Filled 2012-08-25 (×5): qty 1

## 2012-08-25 MED ORDER — ACETAMINOPHEN 10 MG/ML IV SOLN
1000.0000 mg | Freq: Four times a day (QID) | INTRAVENOUS | Status: AC
  Administered 2012-08-25 – 2012-08-26 (×3): 1000 mg via INTRAVENOUS
  Filled 2012-08-25 (×4): qty 100

## 2012-08-25 MED ORDER — NALBUPHINE SYRINGE 5 MG/0.5 ML
INJECTION | INTRAMUSCULAR | Status: AC
  Filled 2012-08-25: qty 1

## 2012-08-25 MED ORDER — MENTHOL 3 MG MT LOZG: 1.0000 | LOZENGE | OROMUCOSAL | Status: DC | PRN

## 2012-08-25 MED ORDER — PHENYLEPHRINE HCL 10 MG/ML IJ SOLN
INTRAMUSCULAR | Status: DC | PRN
  Administered 2012-08-25: 80 ug via INTRAVENOUS
  Administered 2012-08-25 (×3): 40 ug via INTRAVENOUS
  Administered 2012-08-25 (×4): 80 ug via INTRAVENOUS
  Administered 2012-08-25: 40 ug via INTRAVENOUS
  Administered 2012-08-25: 80 ug via INTRAVENOUS

## 2012-08-25 MED ORDER — SIMETHICONE 80 MG PO CHEW
80.0000 mg | CHEWABLE_TABLET | Freq: Three times a day (TID) | ORAL | Status: DC
  Administered 2012-08-25 – 2012-08-29 (×14): 80 mg via ORAL

## 2012-08-25 MED ORDER — NALBUPHINE HCL 10 MG/ML IJ SOLN
5.0000 mg | INTRAMUSCULAR | Status: DC | PRN
  Filled 2012-08-25: qty 1

## 2012-08-25 MED ORDER — SCOPOLAMINE 1 MG/3DAYS TD PT72
MEDICATED_PATCH | TRANSDERMAL | Status: AC
  Administered 2012-08-25: 1.5 mg via TRANSDERMAL
  Filled 2012-08-25: qty 1

## 2012-08-25 MED ORDER — SENNOSIDES-DOCUSATE SODIUM 8.6-50 MG PO TABS
2.0000 | ORAL_TABLET | Freq: Every day | ORAL | Status: DC
  Administered 2012-08-25 – 2012-08-27 (×3): 2 via ORAL

## 2012-08-25 MED ORDER — CLINDAMYCIN PHOSPHATE 900 MG/50ML IV SOLN
900.0000 mg | Freq: Three times a day (TID) | INTRAVENOUS | Status: DC
  Administered 2012-08-25 (×2): 900 mg via INTRAVENOUS
  Filled 2012-08-25 (×2): qty 50

## 2012-08-25 MED ORDER — SIMETHICONE 80 MG PO CHEW
80.0000 mg | CHEWABLE_TABLET | ORAL | Status: DC | PRN
  Administered 2012-08-27: 80 mg via ORAL

## 2012-08-25 MED ORDER — OXYTOCIN 10 UNIT/ML IJ SOLN
40.0000 [IU] | INTRAVENOUS | Status: DC | PRN
  Administered 2012-08-25: 40 [IU] via INTRAVENOUS

## 2012-08-25 MED ORDER — ONDANSETRON HCL 4 MG/2ML IJ SOLN
INTRAMUSCULAR | Status: DC | PRN
  Administered 2012-08-25: 4 mg via INTRAVENOUS

## 2012-08-25 MED ORDER — POTASSIUM CHLORIDE CRYS ER 20 MEQ PO TBCR
40.0000 meq | EXTENDED_RELEASE_TABLET | Freq: Two times a day (BID) | ORAL | Status: DC
  Administered 2012-08-25 – 2012-08-26 (×4): 40 meq via ORAL
  Filled 2012-08-25 (×4): qty 2

## 2012-08-25 MED ORDER — DIPHENHYDRAMINE HCL 25 MG PO CAPS: 25.0000 mg | ORAL_CAPSULE | Freq: Four times a day (QID) | ORAL | Status: DC | PRN

## 2012-08-25 MED ORDER — BUPIVACAINE HCL (PF) 0.25 % IJ SOLN
INTRAMUSCULAR | Status: AC
  Filled 2012-08-25: qty 30

## 2012-08-25 MED ORDER — OXYTOCIN 40 UNITS IN LACTATED RINGERS INFUSION - SIMPLE MED: 62.5000 mL/h | INTRAVENOUS | Status: AC

## 2012-08-25 MED ORDER — WITCH HAZEL-GLYCERIN EX PADS: 1.0000 "application " | MEDICATED_PAD | CUTANEOUS | Status: DC | PRN

## 2012-08-25 MED ORDER — METOCLOPRAMIDE HCL 5 MG/ML IJ SOLN
INTRAMUSCULAR | Status: DC | PRN
  Administered 2012-08-25: 10 mg via INTRAVENOUS

## 2012-08-25 MED ORDER — DIPHENHYDRAMINE HCL 50 MG/ML IJ SOLN: 25.0000 mg | INTRAMUSCULAR | Status: DC | PRN

## 2012-08-25 MED ORDER — FLEET ENEMA 7-19 GM/118ML RE ENEM: 1.0000 | ENEMA | Freq: Every day | RECTAL | Status: DC | PRN

## 2012-08-25 MED ORDER — SODIUM BICARBONATE 8.4 % IV SOLN
INTRAVENOUS | Status: DC | PRN
  Administered 2012-08-25: 5 mL via EPIDURAL

## 2012-08-25 MED ORDER — OXYCODONE-ACETAMINOPHEN 5-325 MG PO TABS
1.0000 | ORAL_TABLET | ORAL | Status: DC | PRN
  Administered 2012-08-26: 2 via ORAL
  Administered 2012-08-26 (×2): 1 via ORAL
  Administered 2012-08-26: 2 via ORAL
  Administered 2012-08-26 – 2012-08-27 (×3): 1 via ORAL
  Administered 2012-08-27 (×2): 2 via ORAL
  Administered 2012-08-27 (×2): 1 via ORAL
  Administered 2012-08-27 – 2012-08-28 (×2): 2 via ORAL
  Administered 2012-08-28: 1 via ORAL
  Administered 2012-08-28: 2 via ORAL
  Administered 2012-08-28 (×2): 1 via ORAL
  Administered 2012-08-29 (×3): 2 via ORAL
  Filled 2012-08-25 (×4): qty 2
  Filled 2012-08-25: qty 1
  Filled 2012-08-25: qty 2
  Filled 2012-08-25: qty 1
  Filled 2012-08-25 (×2): qty 2
  Filled 2012-08-25 (×6): qty 1
  Filled 2012-08-25 (×2): qty 2
  Filled 2012-08-25: qty 1
  Filled 2012-08-25 (×2): qty 2

## 2012-08-25 MED ORDER — SODIUM CHLORIDE 0.9 % IV SOLN: 250.0000 mL | INTRAVENOUS | Status: DC

## 2012-08-25 MED ORDER — DIPHENHYDRAMINE HCL 50 MG/ML IJ SOLN: 12.5000 mg | INTRAMUSCULAR | Status: DC | PRN

## 2012-08-25 MED ORDER — GENTAMICIN SULFATE 40 MG/ML IJ SOLN
170.0000 mg | Freq: Once | INTRAVENOUS | Status: AC
  Administered 2012-08-25: 108 mg via INTRAVENOUS
  Filled 2012-08-25: qty 4.25

## 2012-08-25 MED ORDER — MORPHINE SULFATE (PF) 0.5 MG/ML IJ SOLN
INTRAMUSCULAR | Status: DC | PRN
  Administered 2012-08-25: 3 mg via EPIDURAL

## 2012-08-25 MED ORDER — SODIUM CHLORIDE 0.9 % IJ SOLN: 3.0000 mL | INTRAMUSCULAR | Status: DC | PRN

## 2012-08-25 MED ORDER — OXYTOCIN 10 UNIT/ML IJ SOLN
INTRAMUSCULAR | Status: AC
  Filled 2012-08-25: qty 4

## 2012-08-25 MED ORDER — MORPHINE SULFATE 0.5 MG/ML IJ SOLN
INTRAMUSCULAR | Status: AC
  Filled 2012-08-25: qty 10

## 2012-08-25 MED ORDER — NALBUPHINE HCL 10 MG/ML IJ SOLN
5.0000 mg | INTRAMUSCULAR | Status: DC | PRN
  Administered 2012-08-25: 5 mg via SUBCUTANEOUS
  Filled 2012-08-25: qty 1

## 2012-08-25 MED ORDER — LANOLIN HYDROUS EX OINT: 1.0000 "application " | TOPICAL_OINTMENT | CUTANEOUS | Status: DC | PRN

## 2012-08-25 MED ORDER — NALOXONE HCL 0.4 MG/ML IJ SOLN: 0.4000 mg | INTRAMUSCULAR | Status: DC | PRN

## 2012-08-25 MED ORDER — FENTANYL CITRATE 0.05 MG/ML IJ SOLN: 25.0000 ug | INTRAMUSCULAR | Status: DC | PRN

## 2012-08-25 MED ORDER — METOCLOPRAMIDE HCL 5 MG/ML IJ SOLN: 10.0000 mg | Freq: Three times a day (TID) | INTRAMUSCULAR | Status: DC | PRN

## 2012-08-25 SURGICAL SUPPLY — 42 items
APL SKNCLS STERI-STRIP NONHPOA (GAUZE/BANDAGES/DRESSINGS)
BARRIER ADHS 3X4 INTERCEED (GAUZE/BANDAGES/DRESSINGS) ×4 IMPLANT
BENZOIN TINCTURE PRP APPL 2/3 (GAUZE/BANDAGES/DRESSINGS) IMPLANT
BRR ADH 4X3 ABS CNTRL BYND (GAUZE/BANDAGES/DRESSINGS) ×3
CLOTH BEACON ORANGE TIMEOUT ST (SAFETY) ×2 IMPLANT
CONTAINER PREFILL 10% NBF 15ML (MISCELLANEOUS) IMPLANT
DRAPE LG THREE QUARTER DISP (DRAPES) ×2 IMPLANT
DRSG OPSITE POSTOP 4X10 (GAUZE/BANDAGES/DRESSINGS) ×2 IMPLANT
DURAPREP 26ML APPLICATOR (WOUND CARE) ×2 IMPLANT
ELECT REM PT RETURN 9FT ADLT (ELECTROSURGICAL) ×2
ELECTRODE REM PT RTRN 9FT ADLT (ELECTROSURGICAL) ×1 IMPLANT
EXTRACTOR VACUUM M CUP 4 TUBE (SUCTIONS) IMPLANT
GLOVE BIO SURGEON STRL SZ 6.5 (GLOVE) ×2 IMPLANT
GLOVE BIOGEL PI IND STRL 7.0 (GLOVE) ×1 IMPLANT
GLOVE BIOGEL PI INDICATOR 7.0 (GLOVE) ×1
GOWN STRL REIN XL XLG (GOWN DISPOSABLE) ×4 IMPLANT
KIT ABG SYR 3ML LUER SLIP (SYRINGE) IMPLANT
NDL HYPO 25X1 1.5 SAFETY (NEEDLE) ×1 IMPLANT
NDL HYPO 25X5/8 SAFETYGLIDE (NEEDLE) IMPLANT
NEEDLE HYPO 25X1 1.5 SAFETY (NEEDLE) ×2 IMPLANT
NEEDLE HYPO 25X5/8 SAFETYGLIDE (NEEDLE) IMPLANT
NS IRRIG 1000ML POUR BTL (IV SOLUTION) ×2 IMPLANT
PACK C SECTION WH (CUSTOM PROCEDURE TRAY) ×2 IMPLANT
PAD OB MATERNITY 4.3X12.25 (PERSONAL CARE ITEMS) ×2 IMPLANT
RTRCTR C-SECT PINK 25CM LRG (MISCELLANEOUS) ×1 IMPLANT
STAPLER VISISTAT 35W (STAPLE) IMPLANT
STRIP CLOSURE SKIN 1/2X4 (GAUZE/BANDAGES/DRESSINGS) IMPLANT
SUT CHROMIC GUT AB #0 18 (SUTURE) IMPLANT
SUT MNCRL 0 VIOLET CTX 36 (SUTURE) ×3 IMPLANT
SUT MON AB 4-0 PS1 27 (SUTURE) IMPLANT
SUT MONOCRYL 0 CTX 36 (SUTURE) ×3
SUT PLAIN 2 0 (SUTURE)
SUT PLAIN 2 0 XLH (SUTURE) IMPLANT
SUT PLAIN ABS 2-0 CT1 27XMFL (SUTURE) IMPLANT
SUT VIC AB 0 CT1 27 (SUTURE) ×4
SUT VIC AB 0 CT1 27XBRD ANBCTR (SUTURE) ×2 IMPLANT
SUT VIC AB 2-0 CT1 27 (SUTURE) ×2
SUT VIC AB 2-0 CT1 TAPERPNT 27 (SUTURE) ×1 IMPLANT
SYR CONTROL 10ML LL (SYRINGE) ×2 IMPLANT
TOWEL OR 17X24 6PK STRL BLUE (TOWEL DISPOSABLE) ×6 IMPLANT
TRAY FOLEY CATH 14FR (SET/KITS/TRAYS/PACK) IMPLANT
WATER STERILE IRR 1000ML POUR (IV SOLUTION) ×2 IMPLANT

## 2012-08-25 NOTE — Brief Op Note (Signed)
08/24/2012 - 08/25/2012  7:35 AM  PATIENT:  [name]  47 y.o. female  PRE-OPERATIVE DIAGNOSIS:  Arrest of dilation, Preeclampsia, Class A2 GDM, AMA, morbid obesity  POST-OPERATIVE DIAGNOSIS:  Arrest of dilation, Preeclampsia, Class A2GDM, AMA, morbid obesity  PROCEDURE:  Primary cesarean section, Sharl Ma hysterotomy  SURGEON:  Surgeon(s) and Role:    * Nakia Koble Cathie Beams, MD - Primary  PHYSICIAN ASSISTANT:   ASSISTANTS: none   ANESTHESIA:   epidural Findings: live female LOT, surgical absent left tube and ovary. Bowel adhesion to left side of uterus, right tube and ovary adherent to post serosa, ant placenta. Apgar 9/9 5lb 15 oz  EBL:   600cc I/O:  2200/200 BLOOD ADMINISTERED:none  DRAINS: none   LOCAL MEDICATIONS USED:  MARCAINE     SPECIMEN:  Source of Specimen:  placenta  DISPOSITION OF SPECIMEN:  PATHOLOGY  COUNTS:  YES  TOURNIQUET:  * No tourniquets in log *  DICTATION: .Other Dictation: Dictation Number [id number]  PLAN OF CARE: Admit to inpatient   PATIENT DISPOSITION:  PACU - hemodynamically stable.   Delay start of Pharmacological VTE agent (>24hrs) due to surgical blood loss or risk of bleeding: no

## 2012-08-25 NOTE — Transfer of Care (Signed)
Immediate Anesthesia Transfer of Care Note  Patient: [name]  Procedure(s) Performed: Procedure(s): CESAREAN SECTION (N/A)  Patient Location: PACU  Anesthesia Type:Epidural  Level of Consciousness: awake, alert , oriented and patient cooperative  Airway & Oxygen Therapy: Patient Spontanous Breathing  Post-op Assessment: Report given to PACU RN and Post -op Vital signs reviewed and stable  Post vital signs: stable  Complications: No apparent anesthesia complications

## 2012-08-25 NOTE — Progress Notes (Signed)
S: denies any problem Epidural Pitocin 15 MIU  O: BP 129/55 VE per RN 5/70/-3 Extremity 2+ edema DTR 1+ (-) clonus Tracing reveiwed:  Baseline 120's (+) late decels( repetitive) related to low BP post epidural Ctx q 2 mins Last BS 99 PIH labs done" K 3.2  IMP: Non reassuring tracing related to hypotension( epidural, IV labetalol) resolved with d/c pitocin and Increase  perfusion Preeclampsia on Magnesium(IV). S/P bolus, on labetalol Gbs cx (+) on Vancomycin Class A2GDM AMA Hypothyroidism P)  Pitocin Off. Ephedrine for BP issue. Resume pitocin if ctx spaces. Cont Magnesium . Start potassium

## 2012-08-25 NOTE — Progress Notes (Signed)
S: denies feeling ctx  O: Pitocin BP 173/75 ( 140-198/44-96) VE: 4/70/-3 Extr(+) 2 edema DTR 1+ (-)clonus Tracing cat 1 baseline 125 Ctx q 2- 2 1/2 mins(IUPC)  ImP; Preeclampsia on magnesium Class A2 GDM Hypothyroidism AMA  GBS cx (+) on vancomycin  P) epidural. Cont pitocin. Pt missed synthroid this am. Will add in am. Foley catheter. IV labetalol given. Cont magnesium

## 2012-08-25 NOTE — Anesthesia Postprocedure Evaluation (Signed)
Anesthesia Post Note  Patient: [name]  Procedure(s) Performed: Procedure(s): CESAREAN SECTION (N/A)  Anesthesia type: Spinal  Patient location: Mother Baby  Post pain: Pain level controlled  Post assessment: Post-op Vital signs reviewed  Last Vitals: BP 133/65  Pulse 97  Temp(Src) 36.7 C (Axillary)  Resp 18  Ht 5' 1.5" (1.562 m)  Wt 235 lb (106.595 kg)  BMI 43.69 kg/m2  SpO2 98%  Post vital signs: Reviewed  Level of consciousness: awake  Complications: No apparent anesthesia complications

## 2012-08-25 NOTE — Progress Notes (Signed)
[name] is a 47 y.o. G1P0 at [redacted]w[redacted]d by ultrasound admitted for induction of labor due to Pre-eclamptic toxemia of pregnancy..  Subjective: No chief complaint on file. Pt sleeping Notes some vaginal pressure  Objective: BP 117/68  Pulse 102  Temp(Src) 98.1 F (36.7 C) (Oral)  Resp 18  Ht 5' 1.5" (1.562 m)  Wt 106.595 kg (235 lb)  BMI 43.69 kg/m2  SpO2 99% I/O last 3 completed shifts: In: 1829.2 [P.O.:780; I.V.:849.2; IV Piggyback:200] Out: 950 [Urine:950] Total I/O In: 3293.9 [P.O.:1280; I.V.:1813.9; IV Piggyback:200] Out: 1200 [Urine:1200]  FHT:  FHR: 120's bpm, variability: moderate,  accelerations:  Present,  decelerations:  Absent UC:   regular, every 2 minutes SVE:   5/70/-3 Tracing: cat 1  Labs: Lab Results  Component Value Date   WBC 10.0 08/24/2012   HGB 12.2 08/24/2012   HCT 35.9* 08/24/2012   MCV 82.2 08/24/2012   PLT 227 08/24/2012    Assessment / Plan: Arrest in active phase of labor despite 4 hours of adequate labor Class A2GDM Hypothyroidism AMA Preeclampsia on Magnesium GBS cx(+) on Vancomycin  P) recommend primary C/S. Procedure explained. Risk of surgery reviewed including but not limited to infection, bleeding, injury to surrounding organ structure, internal scar tissue, poss need for blood transfusion. All? Answered. OR notified   Anticipated MOD:  c/s  Tanaja Ganger A 08/25/2012, 6:17 AM

## 2012-08-25 NOTE — Anesthesia Postprocedure Evaluation (Signed)
  Anesthesia Post-op Note  Patient: [name]  Procedure(s) Performed: Procedure(s): CESAREAN SECTION (N/A)  Patient Location: PACU  Anesthesia Type:Epidural  Level of Consciousness: awake, oriented and sedated  Airway and Oxygen Therapy: Patient Spontanous Breathing  Post-op Pain: none  Post-op Assessment: Post-op Vital signs reviewed, Patient's Cardiovascular Status Stable, Respiratory Function Stable, Patent Airway, No signs of Nausea or vomiting, Pain level controlled, No headache and No backache  Post-op Vital Signs: Reviewed and stable  Complications: No apparent anesthesia complications

## 2012-08-25 NOTE — Progress Notes (Signed)
S: comfortable  O: Epidural Pitocin 15 mIU( resumed@ 12:45 am) Magnesium BS 101 BP 134/69 VE: 5/70-80/-3 applied IUPC Tracing: baseline 120's (+) accels mod variability Ctx q 2-4 mins. Adequate MV units 190 since 2:15 am Last PIH labs Nl except low K(3.2)  IMP: Preeclampsia on IV Magnesium, oral labetalol Hypothyroidism Class A2 GDM AMA GBS cx (+) on Vancomycin Hypokalemia on Kdur P) cont increase Pitocin. Cont Kdur. Recheck magnesium level. Synthroid this am. Exaggerated right sims. Reexamine 2 hr . Patient and husband agree with plan

## 2012-08-26 ENCOUNTER — Encounter (HOSPITAL_COMMUNITY): Payer: Self-pay | Admitting: Obstetrics and Gynecology

## 2012-08-26 DIAGNOSIS — O1490 Unspecified pre-eclampsia, unspecified trimester: Secondary | ICD-10-CM

## 2012-08-26 DIAGNOSIS — E039 Hypothyroidism, unspecified: Secondary | ICD-10-CM

## 2012-08-26 DIAGNOSIS — O24419 Gestational diabetes mellitus in pregnancy, unspecified control: Secondary | ICD-10-CM

## 2012-08-26 DIAGNOSIS — O09519 Supervision of elderly primigravida, unspecified trimester: Secondary | ICD-10-CM

## 2012-08-26 DIAGNOSIS — Z98891 History of uterine scar from previous surgery: Secondary | ICD-10-CM

## 2012-08-26 HISTORY — DX: Unspecified pre-eclampsia, unspecified trimester: O14.90

## 2012-08-26 HISTORY — DX: History of uterine scar from previous surgery: Z98.891

## 2012-08-26 HISTORY — DX: Supervision of elderly primigravida, unspecified trimester: O09.519

## 2012-08-26 HISTORY — DX: Hypothyroidism, unspecified: E03.9

## 2012-08-26 HISTORY — DX: Morbid (severe) obesity due to excess calories: E66.01

## 2012-08-26 HISTORY — DX: Gestational diabetes mellitus in pregnancy, unspecified control: O24.419

## 2012-08-26 LAB — COMPREHENSIVE METABOLIC PANEL
ALT: 15 U/L (ref 0–35)
AST: 29 U/L (ref 0–37)
Albumin: 2.3 g/dL — ABNORMAL LOW (ref 3.5–5.2)
Alkaline Phosphatase: 230 U/L — ABNORMAL HIGH (ref 39–117)
BUN: <3 mg/dL — ABNORMAL LOW (ref 6–23)
CO2: 20 mEq/L (ref 19–32)
Calcium: 7.7 mg/dL — ABNORMAL LOW (ref 8.4–10.5)
Chloride: 102 mEq/L (ref 96–112)
Creatinine, Ser: 0.71 mg/dL (ref 0.50–1.10)
GFR calc Af Amer: >90 mL/min (ref >90)
GFR calc non Af Amer: >90 mL/min (ref >90)
Glucose, Bld: 109 mg/dL — ABNORMAL HIGH (ref 70–99)
Potassium: 4.4 mEq/L (ref 3.5–5.1)
Sodium: 134 mEq/L — ABNORMAL LOW (ref 135–145)
Total Bilirubin: 0.3 mg/dL (ref 0.3–1.2)
Total Protein: 5.3 g/dL — ABNORMAL LOW (ref 6.0–8.3)

## 2012-08-26 LAB — CBC
MCHC: 32.8 g/dL (ref 30.0–36.0)
Platelets: 236 10*3/uL (ref 150–400)
RDW: 15.9 % — ABNORMAL HIGH (ref 11.5–15.5)
WBC: 14.9 10*3/uL — ABNORMAL HIGH (ref 4.0–10.5)

## 2012-08-26 MED ORDER — HYDROCHLOROTHIAZIDE 25 MG PO TABS
25.0000 mg | ORAL_TABLET | Freq: Every day | ORAL | Status: DC
  Administered 2012-08-26 – 2012-08-27 (×2): 25 mg via ORAL
  Filled 2012-08-26 (×3): qty 1

## 2012-08-26 MED ORDER — LABETALOL HCL 200 MG PO TABS
200.0000 mg | ORAL_TABLET | Freq: Two times a day (BID) | ORAL | Status: DC
  Administered 2012-08-26 – 2012-08-28 (×5): 200 mg via ORAL
  Filled 2012-08-26 (×6): qty 1

## 2012-08-26 MED ORDER — LACTATED RINGERS IV SOLN
INTRAVENOUS | Status: DC
  Administered 2012-08-26 – 2012-08-27 (×3): via INTRAVENOUS

## 2012-08-26 NOTE — Op Note (Signed)
[name], LACEWELL                 ACCOUNT NO.:  [id number]  MEDICAL RECORD NO.:  [id number]  LOCATION:  9373                          FACILITY:  [facility]  PHYSICIAN:  Maxie Better, M.D.DATE OF BIRTH:  10-18-1965  DATE OF PROCEDURE:  08/25/2012 DATE OF DISCHARGE:                              OPERATIVE REPORT   PREOPERATIVE DIAGNOSES: 1. Arrest of dilatation. 2. Preeclampsia. 3. Class A2 gestational diabetes. 4. Morbid obesity. 5. Intrauterine gestation at 38+ weeks. 6. Hypothyroidism.  PROCEDURE:  Primary cesarean section, Kerr hysterotomy.  POSTOPERATIVE DIAGNOSES: 1. Arrest of dilatation. 2. Preeclampsia. 3. Hyperthyroidism. 4. Class A2 gestational diabetes. 5. Morbid obesity.  ANESTHESIA:  Epidural.  SURGEON:  Maxie Better, M.D.  ASSISTANT:  None.  PROCEDURE IN DETAIL:  Under adequate epidural anesthesia, the patient was placed in the supine position with a left lateral tilt.  She was sterilely prepped and draped in the usual fashion.  An indwelling Foley catheter was already in place draining slightly blood-tinged urine.  The patient was sterilely prepped and draped in the usual fashion.  A Pfannenstiel skin incision was then made, carried down to the rectus fascia with cautery.  Rectus fascia was opened transversely.  Rectus fascia was then bluntly and sharply dissected off the rectus muscle in superior and inferior fashion.  The rectus muscle was split in midline. The parietal peritoneum was entered sharply.  The Alexis retractor was then placed.  The vesicouterine peritoneum was opened transversely.  The bladder was then bluntly dissected off the lower uterine segment, displaced inferiorly.  The curvilinear low transverse uterine incision was then made and extended bluntly.  Subsequent delivery of a live female from the left occiput transverse position was accomplished.  Baby was bulb suctioned.  The abdomen cord was clamped, cut.  The baby  was transferred to the awaiting pediatrician who assigned Apgars of 9 and 9 at 1 and 5 minutes.  The placenta, which was anterior was manually removed.  Uterine cavity was cleaned of debris.  Uterine incision had no extension, was closed in 2 layers, the first layer with 0 Monocryl running locked stitch, second layer was imbricated using 0 Monocryl suture.  The abdomen was then copiously irrigated and suctioned.  The uterus was delivered atonic, and therefore Hemabate was intraoperatively given by the anesthesiologist.  The uterus was exteriorized.  The patient had a prior loss of the left tube and ovary.  There was a bowel adhesion to that left side of the uterus.  On the right, the right ovary was plastered along the posterior aspect of the uterus.  That was lysed off.  The bowels was then carefully lysed off the left side as well. Irrigation suction of that area was done.  Interceed was placed overlying the left side of the area where the bowels was removed.  The uterus was returned to the abdomen carefully.  Interceed was placed over the lower uterine segment.  The parietal peritoneum was then closed with 2-0 Vicryl.  The rectus fascia was closed with 0 Vicryl x2.  The Alexis retractor had been removed.  The subcutaneous area was irrigated, small bleeders cauterized.  Interrupted 2-0 plain sutures placed, and the skin  approximated with Ethicon staples.  SPECIMEN:  Placenta sent to Pathology.  ESTIMATED BLOOD LOSS:  600 mL.  INTRAOPERATIVE FLUID:  2300 mL.  URINE OUTPUT:  50 mL of blood-tinged urine.  Sponge and instrument counts x2 was correct.  COMPLICATION:  None.  The patient tolerated procedure well, was transferred to recovery in stable condition.  The baby was weighed 5 pounds 15 ounces.     Maxie Better, M.D.     [place]/MEDQ  D:  08/25/2012  T:  08/26/2012  Job:  [id number]

## 2012-08-26 NOTE — Progress Notes (Signed)
Pt had 2 unmeasured voids - one this AM when measuring pan was not in toilet & the 2nd when her peripad ended up between her & the measuring pan - blue pad & smaller peripad were saturated with urine.

## 2012-08-26 NOTE — Progress Notes (Signed)
Patient ID: [name], female   DOB: 10/24/1965, 47 y.o.   MRN: [id number] POD # 1  Subjective: Pt reports feeling tired, but well/ Pain controlled with ibuprofen and intra op meds at present Tolerating po/ Foley d/c'ed and pt has voided x 2, with 1 unmeasured void/ No n/v/Flatus neg Denies epigastric pain, HA, blurred vision, CP, SOB Activity: up with assistance Bleeding is light Newborn info:  Information for the patient's newborn:  Jody, Cline [[id number]]  female Feeding: bottle; hx breast reduction and unstable infant glucose   Objective: VS: Blood pressure 137/64, pulse 106, temperature 98.2 F (36.8 C), temperature source Oral, resp. rate 18.     Recent Labs  08/25/12 1200 08/26/12 0601  WBC 14.4* 14.9*  HGB 11.3* 10.5*  HCT 33.8* 32.0*  PLT 207 236   CMP: K: 4.4 Na: 134 Cr Cl:  0.71 Liver enzymes WNL   I & O: Net +635   Blood type: --/--/A POS (05/13 1153) Rubella: Immune (10/17 0000)    Physical Exam:  General: alert, cooperative and no distress CV: Regular rate and rhythm Resp: clear Abdomen: soft, nontender, normal bowel sounds Incision: well approximated, covered with tegaderm and honeycomb dressing; old dry bloody drainage noted Uterine Fundus: firm, below umbilicus, nontender Lochia: minimal Ext: edema +3 pedal and pretib and Homans sign is negative, no sign of DVT    A/P: POD # 1/ G1P1001 S/P C/Section d/t Arrest of Descent, PEC PEC; continue Mag and monitor BP's and output Hx GDM A2, stable; Hypothyroidism, AMA, Morbid Obesity Will update Dr Cherly Hensen Stable at present   Signed: Demetrius Revel, MSN, [facility] 08/26/2012, 10:19 AM

## 2012-08-26 NOTE — Progress Notes (Signed)
Visitors in room.

## 2012-08-26 NOTE — Progress Notes (Signed)
UR chart review completed.  

## 2012-08-27 NOTE — Progress Notes (Signed)
Pt transferred via w/c to [facility] Rm #[id number]. Report updated to [place], [place]

## 2012-08-27 NOTE — Progress Notes (Signed)
Patient ID: [name], female   DOB: 08/22/1965, 47 y.o.   MRN: [id number] POD # 2  Subjective: Pt reports feeling "ok"; sore/ Pain controlled with ibuprofen and percocet Denies HA, epigastric pain, SOB, CP; vague c/o occasional blurred vision Tolerating po/Voiding without problems/ No n/v/Flatus neg Activity: up with assistance Bleeding is light Newborn info:  Information for the patient's newborn:  [name], [name] [[id number]]  female Feeding: breast   Objective: VS: Blood pressure 170/83, pulse 101, temperature 98.6 F (37 C), temperature source Oral, resp. rate 18.   BP range in past 12 hrs:  126 - 170/ 65 - 93  (Most recent) 126/62 Net I&O: +350, however, unmeasured voids x 2 w/ average void 400-649ml   Physical Exam:  General: alert, cooperative and no distress CV: Regular rate and rhythm Resp: clear Abdomen: soft, NT; faint, hypoactive BS x 4 quads Uterine Fundus: firm, below umbilicus, nontender Incision: well approximated and covered with Tegaderm and honeycomb dressing Lochia: minimal Ext: edema +1 to +2 and Homans sign is negative, no sign of DVT    A/P: POD # 2/ G1P1001/ S/P C/Section d/t Arrest of descent PEC with persistent labile BP's this am; Labetalol increased last pm and HCTZ added Hx GDM A2, Morbid obesity, AMA, Hypothyroidism Stable at present will update MD.   Signed: Demetrius Revel, MSN, [facility] 08/27/2012, 9:44 AM

## 2012-08-28 LAB — CBC
HCT: 32.6 % — ABNORMAL LOW (ref 36.0–46.0)
Hemoglobin: 10.9 g/dL — ABNORMAL LOW (ref 12.0–15.0)
MCH: 27.7 pg (ref 26.0–34.0)
MCHC: 33.4 g/dL (ref 30.0–36.0)
MCV: 83 fL (ref 78.0–100.0)
Platelets: 281 10*3/uL (ref 150–400)
RBC: 3.93 MIL/uL (ref 3.87–5.11)
RDW: 15.8 % — ABNORMAL HIGH (ref 11.5–15.5)
WBC: 9.5 10*3/uL (ref 4.0–10.5)

## 2012-08-28 LAB — COMPREHENSIVE METABOLIC PANEL
ALT: 21 U/L (ref 0–35)
AST: 29 U/L (ref 0–37)
Albumin: 2.3 g/dL — ABNORMAL LOW (ref 3.5–5.2)
Alkaline Phosphatase: 212 U/L — ABNORMAL HIGH (ref 39–117)
BUN: 6 mg/dL (ref 6–23)
CO2: 24 mEq/L (ref 19–32)
Calcium: 8.2 mg/dL — ABNORMAL LOW (ref 8.4–10.5)
Chloride: 98 mEq/L (ref 96–112)
Creatinine, Ser: 0.59 mg/dL (ref 0.50–1.10)
GFR calc Af Amer: >90 mL/min (ref >90)
GFR calc non Af Amer: >90 mL/min (ref >90)
Glucose, Bld: 115 mg/dL — ABNORMAL HIGH (ref 70–99)
Potassium: 3.5 mEq/L (ref 3.5–5.1)
Sodium: 133 mEq/L — ABNORMAL LOW (ref 135–145)
Total Bilirubin: 0.5 mg/dL (ref 0.3–1.2)
Total Protein: 5.6 g/dL — ABNORMAL LOW (ref 6.0–8.3)

## 2012-08-28 LAB — URIC ACID: Uric Acid, Serum: 5.9 mg/dL (ref 2.4–7.0)

## 2012-08-28 MED ORDER — HYDROCHLOROTHIAZIDE 50 MG PO TABS
50.0000 mg | ORAL_TABLET | Freq: Every day | ORAL | Status: DC
  Administered 2012-08-28 – 2012-08-29 (×2): 50 mg via ORAL
  Filled 2012-08-28 (×2): qty 1

## 2012-08-28 MED ORDER — MAGNESIUM HYDROXIDE 400 MG/5ML PO SUSP
30.0000 mL | Freq: Every day | ORAL | Status: DC | PRN
  Administered 2012-08-28: 30 mL via ORAL
  Filled 2012-08-28: qty 30

## 2012-08-28 MED ORDER — FLEET ENEMA 7-19 GM/118ML RE ENEM
1.0000 | ENEMA | Freq: Once | RECTAL | Status: AC
  Administered 2012-08-28: 1 via RECTAL

## 2012-08-28 NOTE — Progress Notes (Signed)
Called Dr Seymour Bars, patient states everything she has tried has not worked to have a bowel movement, she feels nauseated, order received.

## 2012-08-28 NOTE — Progress Notes (Signed)
Patient ambulating in hallway, states she feels much better then she did this am.

## 2012-08-28 NOTE — Progress Notes (Signed)
Patient ID: [name], female   DOB: 01-Mar-1966, 47 y.o.   MRN: [id number] POD # 3  Subjective: Pt reports feeling nauseated and bloated/ Pain controlled with percocet (ASA allergy) C/O upper abd tenderness and nausea.  Denies blurred vision, HA, CP, SOB Pt not sure if she feels "gassy" or "constipated", "just uncomfortable Tolerating po/Voiding without problems/ No n/v/Flatus pos Activity: out of bed and ambulate Bleeding is light Newborn info:  Information for the patient's newborn:  Valla, Pacey [[id number]]  female Feeding: bottle   Objective: VS: Blood pressure 180/98, pulse 91, temperature 98.4 F (36.9 C), temperature source Oral, resp. rate 18.   LABS:  Recent Labs  08/25/12 1200 08/26/12 0601  WBC 14.4* 14.9*  HGB 11.3* 10.5*  HCT 33.8* 32.0*  PLT 207 236     Physical Exam:  General: alert, cooperative and no distress CV: Regular rate and rhythm Resp: clear Abdomen: soft, nontender, normal bowel sounds Incision: well approximated Uterine Fundus: firm, below umbilicus, nontender Lochia: minimal Ext: edema +2 to +3 pedal and pretib and Homans sign is negative, no sign of DVT    A/P: POD # 3/ G1P1001/ S/P C/Section d/t Arrest of descent, PEC BP's remain labile and pedal edema increased; will increase HCTZ PIH labs, cont I&O, BPS Q 4 hours, and resume daily weights Ambulate minimum TID Hx GDM A2, hypothyroidism, AMA   Signed: Demetrius Revel, MSN, [facility] 08/28/2012, 10:31 AM

## 2012-08-29 ENCOUNTER — Encounter (HOSPITAL_COMMUNITY)
Admission: RE | Admit: 2012-08-29 | Discharge: 2012-08-29 | Disposition: A | Discharge status: Home / Self Care | Payer: BC Managed Care – PPO | Source: Ambulatory Visit | Attending: Obstetrics and Gynecology | Admitting: Obstetrics and Gynecology

## 2012-08-29 DIAGNOSIS — O923 Agalactia: Secondary | ICD-10-CM | POA: Insufficient documentation

## 2012-08-29 MED ORDER — FERROUS SULFATE 325 (65 FE) MG PO TABS: 325.0000 mg | ORAL_TABLET | Freq: Two times a day (BID) | ORAL | Status: DC

## 2012-08-29 MED ORDER — LABETALOL HCL 300 MG PO TABS: 300.0000 mg | ORAL_TABLET | Freq: Three times a day (TID) | ORAL | Status: DC

## 2012-08-29 MED ORDER — HYDROCHLOROTHIAZIDE 50 MG PO TABS: 50.0000 mg | ORAL_TABLET | Freq: Every day | ORAL | Status: DC

## 2012-08-29 MED ORDER — OXYCODONE-ACETAMINOPHEN 5-325 MG PO TABS: 1.0000 | ORAL_TABLET | ORAL | Status: DC | PRN

## 2012-08-29 MED ORDER — DOCUSATE SODIUM 100 MG PO CAPS: 100.0000 mg | ORAL_CAPSULE | Freq: Three times a day (TID) | ORAL | Status: DC | PRN

## 2012-08-29 MED ORDER — LABETALOL HCL 200 MG PO TABS
200.0000 mg | ORAL_TABLET | Freq: Once | ORAL | Status: AC
  Administered 2012-08-29: 200 mg via ORAL
  Filled 2012-08-29: qty 1

## 2012-08-29 MED ORDER — LABETALOL HCL 300 MG PO TABS
300.0000 mg | ORAL_TABLET | Freq: Two times a day (BID) | ORAL | Status: DC
  Administered 2012-08-29: 300 mg via ORAL
  Filled 2012-08-29: qty 1

## 2012-08-29 NOTE — Progress Notes (Signed)
Dr Seymour Bars called and given report of Patients elevated B/P. Orders given to dose patient with 200 mg Labetalol now and to increase dosage to 300 mg BID with AM dose. To retake B/P in one hour.

## 2012-08-29 NOTE — Progress Notes (Signed)
Patient ID: [name], female   DOB: 08-Jul-1965, 47 y.o.   MRN: [id number] POD # 4  Subjective: Pt reports feeling "much better!!" and eager for d/c home/ Pain controlled with percocet Feelings of upper abdominal pain and "gas pain" markedly improved.  Pos BM after enema and MOM Tolerating po/Voiding without problems/ No n/v/Flatus pos Activity: out of bed and ambulate Bleeding is light Newborn info:  Information for the patient's newborn:  Cherrill, Scrima [[id number]]  female  Feeding: breast   Objective: VS: Blood pressure 153/86, pulse 90, temperature 98 F (36.7 C), temperature source Oral, resp. rate 20   Most recent BP: 141/83  (1 hr after Labetalol 300mg  po) 10 lb wt loss  LABS:  Recent Labs  08/28/12 1056  WBC 9.5  HGB 10.9*  HCT 32.6*  PLT 281     Physical Exam:  General: alert, cooperative and no distress CV: Regular rate and rhythm Resp: clear Abdomen: soft, nontender, normal bowel sounds Incision: well approximated Uterine Fundus: firm, below umbilicus, nontender Lochia: minimal Ext: edema +2 pedal and pretib and Homans sign is negative, no sign of DVT    A/P: POD # 4/ G1P1001/ S/P Primary C/Section d/t failure to progress and PEC PEC; BP's remain labile but improving.  Will recheck in the office in 3 to 4 days Hx GDM A1, Hypothyroidism, Morbid Obesity Tegaderm dressing removed; will not replace Stable for discharge home RX's: Labetalol 300mg  po TID HCTZ 50mg  po QD x 5 days Percocet 5/325 1 - 2 tabs po every 4 hrs prn pain  #40 No refill Colace 200 to 300mg  /day prn #60 Ref x 1  Staple removal in the office with BP recheck     Signed: Demetrius Revel, MSN, [facility] 08/29/2012, 10:45 AM

## 2012-08-31 NOTE — Discharge Summary (Signed)
Obstetric Discharge Summary Reason for Admission: G1 P0 @ 28 wks for IOL due to PEC    BP in office 188/112, PIH labs WNL.  BPP 8/8 and upper normal AFI    Hx GDM A2, Hypothyroidism, Morbid obesity, AMA    GBS Pos Prenatal Procedures: NST and ultrasound Intrapartum Procedures: Primary cesarean section, magnesium sulfate Postpartum Procedures: antibiotics Complications-Operative and Postpartum: PEC, labile BP's. Hemoglobin  Date Value Range Status  08/28/2012 10.9* 12.0 - 15.0 g/dL Final     HCT  Date Value Range Status  08/28/2012 32.6* 36.0 - 46.0 % Final    Physical Exam:  General: alert, cooperative and no distress Lochia: appropriate Uterine Fundus: firm Incision: Well approximated and closed with staples DVT Evaluation: No evidence of DVT seen on physical exam. Negative Homan's sign. Calf/Ankle edema is present.  Discharge Diagnoses: G1 P1, s/p primary c/s due to Arrest of Descent and PEC.  Persistent labile BP's.  Will continue antihypertensive and HCTZ until reevaluation in the office later this week.  Discharge Information: Date: 08/31/2012 Activity: pelvic rest Diet: routine Medications: PNV, Ibuprofen, Colace, Percocet and labetalol and HCTZ Condition: stable Instructions: refer to practice specific booklet Discharge to: home Follow-up Information   Follow up with Odessie Polzin A, MD In 6 weeks. (BP check and staple removal in the office 5/21 or 5/22)    Contact information:   [address] [place] [facility] [address] [phone]       Newborn Data: Live born female on 08/[birth year]/12 Birth Weight: 5 lb 15 oz (2693 g) APGAR: 9, 9  Home with mother.  [name],[name] 08/31/2012, 10:42 AM

## 2012-09-03 ENCOUNTER — Ambulatory Visit: Payer: Self-pay

## 2012-09-03 NOTE — Lactation Note (Signed)
This note was copied from the chart of [name]. Adult Lactation Consultation Outpatient Visit Note Follow up for history of breast reduction surgery 9 years ago  Patient Name: [name]      Mother's name Zayne Marovich  DOB: 11/17/[birth year]66        Date of Birth: 08/25/2011 Gestational Age at Delivery: 38 3/7 weeks  Birth weight: 5 lbs 15 oz  Today's weight: 6 lbs. 49.91 oz (3 days old) Type of Delivery: Cesarean Section    Supplementing / Method: Pumping:  Type of Pump: [employer] pump   Frequency: 3 times a day  Volume:  11-14 ml per pumping Supplementing: 59 ml every 3-4 hrs formula adding breast milk when available, by slow flow bottle  Comments: Mom comes in today for an OP Lactation Consultation.  Mom rented a DEBP from hospital, and recently has been pumping 3 times a day and obtaining 11-14 ml.  Baby is primarily receiving formula by bottle every 3-4 hrs, adding breast milk as able.  Mom states baby had been breast feeding between feedings, but baby not latching on deeply.  Mom reports putting baby to breast often, for 15-60 mins.  She lets baby sleep at the breast often.  Talked about trying an SNS at the breast, to increase baby's nutritive sucking/swallowing at the breast which is stimulating for her milk supply.  Mom agreed to try in office.   Consultation Evaluation:  Initial Feeding Assessment: Pre-feed Weight: 2832 gm Post-feed Weight: 2868 gm Amount Transferred: 36 ml  Comments:  Right breast, with 60 ml formula in SNS, baby took about 35 ml from SNS.  Baby initially latching onto nipple, sucking and dimpling in with her cheeks.  She was making sucking, smacking noises at the breast, which Mom states that is what she had been doing at home.  With assistance, baby was able to latch deeply onto areola, using her jaw and compressing the breast.  Mom states she felt a difference, more of a tug.  Baby stayed on for 20 minutes.  Mom agreeable to use the SNS periodically  throughout the day.  Mom states baby had half a feeding an hour prior to the appointment, and we had to awaken her for this feeding.  Talked about growth spurts, and how babies will cue to feed more often that every 4 hrs.  Mom and Dad were trying to get baby on a schedule.  Encouraged them to watch for cues, and feed Meagan when she is hungry, even if it is an hour after last feeding.  They were encouraged to wake her if she has been sleeping 4 hrs, but expect to feed her every 2-3 hrs, 60 ml, and increasing as tolerated.  Encouraged double pumping more often every 2-3 hrs for 15-20 mins.  Additional Interventions:  Information on Fenugreek, Moringa, and Lactation Cookie recipe given.   Follow-Up  Thursday, May 29th @ 9am    [name] 09/03/2012, 11:28 AM

## 2012-09-29 ENCOUNTER — Encounter (HOSPITAL_COMMUNITY)
Admission: RE | Admit: 2012-09-29 | Discharge: 2012-09-29 | Disposition: A | Discharge status: Home / Self Care | Payer: BC Managed Care – PPO | Source: Ambulatory Visit | Attending: Obstetrics and Gynecology | Admitting: Obstetrics and Gynecology

## 2012-09-29 DIAGNOSIS — O923 Agalactia: Secondary | ICD-10-CM | POA: Insufficient documentation

## 2013-05-17 ENCOUNTER — Other Ambulatory Visit: Payer: Self-pay

## 2013-05-17 DIAGNOSIS — Z1231 Encounter for screening mammogram for malignant neoplasm of breast: Secondary | ICD-10-CM

## 2013-06-01 ENCOUNTER — Ambulatory Visit
Admission: RE | Admit: 2013-06-01 | Discharge: 2013-06-01 | Disposition: A | Discharge status: Home / Self Care | Payer: BC Managed Care – PPO | Source: Ambulatory Visit

## 2013-06-01 DIAGNOSIS — Z1231 Encounter for screening mammogram for malignant neoplasm of breast: Secondary | ICD-10-CM

## 2013-09-09 ENCOUNTER — Other Ambulatory Visit: Payer: Self-pay | Admitting: Family Medicine

## 2013-09-09 DIAGNOSIS — N644 Mastodynia: Secondary | ICD-10-CM

## 2013-09-20 ENCOUNTER — Ambulatory Visit
Admission: RE | Admit: 2013-09-20 | Discharge: 2013-09-20 | Disposition: A | Discharge status: Home / Self Care | Payer: BC Managed Care – PPO | Source: Ambulatory Visit | Attending: Family Medicine | Admitting: Family Medicine

## 2013-09-20 DIAGNOSIS — N644 Mastodynia: Secondary | ICD-10-CM

## 2013-11-07 ENCOUNTER — Telehealth: Payer: Self-pay | Admitting: Genetic Counselor

## 2013-11-07 NOTE — Telephone Encounter (Signed)
PER PATIENT WILL RETURN CALL AFTER SPEAKING W/ SPOUSE CONTACT INFORMATION GIVEN.

## 2014-02-16 ENCOUNTER — Other Ambulatory Visit: Payer: Self-pay | Admitting: Family Medicine

## 2014-02-16 DIAGNOSIS — R748 Abnormal levels of other serum enzymes: Secondary | ICD-10-CM

## 2014-02-23 ENCOUNTER — Ambulatory Visit
Admission: RE | Admit: 2014-02-23 | Discharge: 2014-02-23 | Disposition: A | Discharge status: Home / Self Care | Payer: BC Managed Care – PPO | Source: Ambulatory Visit | Attending: Family Medicine | Admitting: Family Medicine

## 2014-02-23 DIAGNOSIS — R748 Abnormal levels of other serum enzymes: Secondary | ICD-10-CM

## 2016-03-25 ENCOUNTER — Ambulatory Visit (HOSPITAL_COMMUNITY)
Admission: RE | Admit: 2016-03-25 | Discharge: 2016-03-25 | Disposition: A | Discharge status: Home / Self Care | Payer: BLUE CROSS/BLUE SHIELD | Source: Ambulatory Visit | Attending: Family Medicine | Admitting: Family Medicine

## 2016-03-25 ENCOUNTER — Other Ambulatory Visit (HOSPITAL_COMMUNITY): Payer: Self-pay | Admitting: Family Medicine

## 2016-03-25 DIAGNOSIS — M79605 Pain in left leg: Secondary | ICD-10-CM

## 2016-03-25 DIAGNOSIS — M7989 Other specified soft tissue disorders: Secondary | ICD-10-CM

## 2016-03-25 NOTE — Progress Notes (Signed)
**  Preliminary report by tech**  Left lower extremity venous duplex complete. There is no evidence of deep or superficial vein thrombosis involving the left lower extremity. All visualized vessels appear patent and compressible. There is no evidence of a Baker's cyst on the left. [facility] [phone].  03/25/16 2:48 PM Olen CordialGreg Ellenor Wisniewski RVT

## 2016-10-28 ENCOUNTER — Other Ambulatory Visit: Payer: Self-pay | Admitting: Obstetrics and Gynecology

## 2016-10-28 DIAGNOSIS — Z1231 Encounter for screening mammogram for malignant neoplasm of breast: Secondary | ICD-10-CM

## 2016-11-12 ENCOUNTER — Other Ambulatory Visit: Payer: Self-pay | Admitting: Obstetrics and Gynecology

## 2016-11-12 DIAGNOSIS — N644 Mastodynia: Secondary | ICD-10-CM

## 2016-11-18 ENCOUNTER — Ambulatory Visit
Admission: RE | Admit: 2016-11-18 | Discharge: 2016-11-18 | Disposition: A | Discharge status: Home / Self Care | Payer: BLUE CROSS/BLUE SHIELD | Source: Ambulatory Visit | Attending: Obstetrics and Gynecology | Admitting: Obstetrics and Gynecology

## 2016-11-18 ENCOUNTER — Other Ambulatory Visit: Payer: Self-pay | Admitting: Obstetrics and Gynecology

## 2016-11-18 DIAGNOSIS — N644 Mastodynia: Secondary | ICD-10-CM

## 2016-12-18 ENCOUNTER — Ambulatory Visit: Payer: BLUE CROSS/BLUE SHIELD

## 2016-12-18 ENCOUNTER — Other Ambulatory Visit: Payer: Self-pay | Admitting: Obstetrics and Gynecology

## 2016-12-18 DIAGNOSIS — Z1231 Encounter for screening mammogram for malignant neoplasm of breast: Secondary | ICD-10-CM

## 2016-12-19 ENCOUNTER — Ambulatory Visit: Payer: BLUE CROSS/BLUE SHIELD

## 2017-01-16 ENCOUNTER — Ambulatory Visit: Payer: BLUE CROSS/BLUE SHIELD

## 2018-05-03 ENCOUNTER — Other Ambulatory Visit: Payer: Self-pay | Admitting: Obstetrics and Gynecology

## 2018-05-03 ENCOUNTER — Ambulatory Visit: Payer: Self-pay | Admitting: Surgery

## 2018-05-03 NOTE — H&P (Signed)
[name] Documented: 05/03/2018 3:06 PM Location: [facility] Patient #: [id number] DOB: 04/03/1966 Married / Language: English / Race: Black or African American Female  History of Present Illness Ardeth Sportsman(Jillianna Stanek C. Starlena Beil MD; 05/03/2018 4:06 PM) The patient is a 53 year old female who presents for evaluation of gall stones. Note for "Gall stones": ` ` ` Patient sent for surgical consultation at the request of Dr Manus GunningEhinger  Chief Complaint: abdominal pain and gallstones. ` ` The patient is a movements had intermittent abdominal pains. Episode in 2015 that confirmed gallstones by ultrasound. Recently had an episode of upper abdominal pain. Somewhat right-sided. This is when she was living down in [place]. Ultrasound confirmed gallstones. On another issues as well, including some neurological weakness. An extensive neurological workup including MRI, CT scan, EEG. I believe that workup was negative. Return to town with many concerns and complaints to her primary care physician. He obtain records. Dr Manus GunningEhinger wondered if the gallstones could be explainning some of her issues. Surgical consultation requested.  patient comes in today with her husband. Frustrated. She recalls an episode after a heavy meal in mid December. They were trying a keto diet which was heavy in protein and fat. Had upper abdominal pain. Especially right upper quadrant. Related to flank. Felt nauseated. Some back pain. Recurrent symptoms. It's cured her. She'll most on she's having a heart attack. Feeling some numbness. There is discussion about getting an ultrasound that she had worsening symptoms, serve primary care physician sent her to the emergency room. As noted above, neurological workup for MS, stroke, etc. and otherwise negative. Since that more severe attack she still has been getting intermittent pains. She now has a chronic achiness in her right upper quadrant. They switch to a low-fat  diet couple weeks ago. The attacks have gone down but she still struggles with some discomfort. She recalls being told she had a hiatal hernia. He was given a prescription for Protonix but she shouldn't helped somewhat. Did not stop the attacks. 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 No history of kidney stones or kidney infections or UTIs. No vaginal bleeding or discharge. Normally moves her bowels every days. No history of ulcer blood disorder inflammatory bowel disease. No irritable bowel. No family history of issues. She does have some history of anxiety on Xanax when she was younger. She wonders if the tingling in her hands and chest pressure discomfort was more anxiety since cardiac and neurologic workup was otherwise negative.  No personal nor family history of GI/colon cancer, inflammatory bowel disease, irritable bowel syndrome, allergy such as Celiac Sprue, dietary/dairy problems, colitis, ulcers nor gastritis. No recent sick contacts/gastroenteritis. No travel outside the country. No changes in diet. No dysphagia to solids or liquids. No hematochezia, hematemesis, coffee ground emesis. No evidence of prior gastric/peptic ulceration. She felt tired after walking for an hour but was able to do the walk. She does not smoke. She is nondiabetic.  (Review of systems as stated in this history (HPI) or in the review of systems. Otherwise all other 12 point ROS are negative) ` ` `   Past Surgical History Ethlyn Gallery(Alisha Spillers, CMA; 05/03/2018 3:07 PM) Cesarean Section - 1 Mammoplasty; Reduction Bilateral. Oral Surgery  Diagnostic Studies History Ethlyn Gallery(Alisha Spillers, CMA; 05/03/2018 3:07 PM) Colonoscopy never Mammogram within last year Pap Smear 1-5 years ago  Allergies Ethlyn Gallery(Alisha Spillers, CMA; 05/03/2018 3:08 PM) Aspirin 81 *ANALGESICS - NonNarcotic* Penicillin G Benzathine &  Proc *PENICILLINS*  Medication  History Ethlyn Gallery, CMA; 05/03/2018 3:09 PM) Levothyroxine Sodium ( Tablet, Oral) Active. Vitamin D (Cholecalciferol) (1000UNIT Capsule, Oral) Active. Pantoprazole Sodium (20MG  Tablet DR, Oral) Active. ALPRAZolam (0.5MG  Tablet, Oral) Active. Medications Reconciled  Social History Ethlyn Gallery, CMA; 05/03/2018 3:07 PM) Caffeine use Carbonated beverages, Coffee, Tea. No alcohol use No drug use Tobacco use Never smoker.  Family History Ethlyn Gallery, CMA; 05/03/2018 3:07 PM) Anesthetic complications Mother. Cerebrovascular Accident Father. Diabetes Mellitus Mother. Hypertension Mother.  Pregnancy / Birth History Ethlyn Gallery, CMA; 05/03/2018 3:07 PM) Age at menarche 13 years. Age of menopause 31-50 Contraceptive History Oral contraceptives. Gravida 1 Maternal age >5 Para 1 Regular periods  Other Problems Ethlyn Gallery, CMA; 05/03/2018 3:07 PM) Anxiety Disorder Gastroesophageal Reflux Disease Heart murmur Hemorrhoids Oophorectomy Bilateral. Sleep Apnea Thyroid Disease     Review of Systems (Alisha Spillers CMA; 05/03/2018 3:07 PM) General Present- Fatigue. Not Present- Appetite Loss, Chills, Fever, Night Sweats, Weight Gain and Weight Loss. HEENT Present- Wears glasses/contact lenses. Not Present- Earache, Hearing Loss, Hoarseness, Nose Bleed, Oral Ulcers, Ringing in the Ears, Seasonal Allergies, Sinus Pain, Sore Throat, Visual Disturbances and Yellow Eyes. Respiratory Present- Snoring. Not Present- Bloody sputum, Chronic Cough, Difficulty Breathing and Wheezing. Breast Not Present- Breast Mass, Breast Pain, Nipple Discharge and Skin Changes. Cardiovascular Present- Shortness of Breath. Not Present- Chest Pain, Difficulty Breathing Lying Down, Leg Cramps, Palpitations, Rapid Heart Rate and Swelling of Extremities. Gastrointestinal Present- Abdominal Pain, Indigestion and Nausea. Not Present- Bloating, Bloody Stool, Change in  Bowel Habits, Chronic diarrhea, Constipation, Difficulty Swallowing, Excessive gas, Gets full quickly at meals, Hemorrhoids, Rectal Pain and Vomiting. Female Genitourinary Present- Pelvic Pain. Not Present- Frequency, Nocturia, Painful Urination and Urgency. Musculoskeletal Present- Back Pain and Muscle Weakness. Not Present- Joint Pain, Joint Stiffness, Muscle Pain and Swelling of Extremities. Psychiatric Present- Anxiety and Change in Sleep Pattern. Not Present- Bipolar, Depression, Fearful and Frequent crying. Hematology Not Present- Blood Thinners, Easy Bruising, Excessive bleeding, Gland problems, HIV and Persistent Infections.  Vitals (Alisha Spillers CMA; 05/03/2018 3:08 PM) 05/03/2018 3:07 PM Weight: 190 lb Height: 61.5in Body Surface Area: 1.86 m Body Mass Index: 35.32 kg/m  Pulse: 88 (Regular)  BP: 146/78 (Sitting, Left Arm, Standard)      Physical Exam Ardeth Sportsman MD; 05/03/2018 3:59 PM)  General Mental Status-Alert. General Appearance-Not in acute distress, Not Sickly. Orientation-Oriented X3. Hydration-Well hydrated. Voice-Normal. Note: Moves around normally. Not toxic. Not sickly.  Integumentary Global Assessment Upon inspection and palpation of skin surfaces of the - Axillae: non-tender, no inflammation or ulceration, no drainage. and Distribution of scalp and body hair is normal. General Characteristics Temperature - normal warmth is noted.  Head and Neck Head-normocephalic, atraumatic with no lesions or palpable masses. Face Global Assessment - atraumatic, no absence of expression. Neck Global Assessment - no abnormal movements, no bruit auscultated on the right, no bruit auscultated on the left, no decreased range of motion, non-tender. Trachea-midline. Thyroid Gland Characteristics - non-tender.  Eye Eyeball - Left-Extraocular movements intact, No Nystagmus. Eyeball - Right-Extraocular movements intact, No  Nystagmus. Cornea - Left-No Hazy. Cornea - Right-No Hazy. Sclera/Conjunctiva - Left-No scleral icterus, No Discharge. Sclera/Conjunctiva - Right-No scleral icterus, No Discharge. Pupil - Left-Direct reaction to light normal. Pupil - Right-Direct reaction to light normal.  ENMT Ears Pinna - Left - no drainage observed, no generalized tenderness observed. Right - no drainage observed, no generalized tenderness observed. Nose and Sinuses External Inspection of the Nose - no destructive lesion observed. Inspection  of the nares - Left - quiet respiration. Right - quiet respiration. Mouth and Throat Lips - Upper Lip - no fissures observed, no pallor noted. Lower Lip - no fissures observed, no pallor noted. Nasopharynx - no discharge present. Oral Cavity/Oropharynx - Tongue - no dryness observed. Oral Mucosa - no cyanosis observed. Hypopharynx - no evidence of airway distress observed.  Chest and Lung Exam Inspection Movements - Normal and Symmetrical. Accessory muscles - No use of accessory muscles in breathing. Palpation Palpation of the chest reveals - Non-tender. Auscultation Breath sounds - Normal and Clear.  Cardiovascular Auscultation Rhythm - Regular. Murmurs & Other Heart Sounds - Auscultation of the heart reveals - No Murmurs and No Systolic Clicks.  Abdomen Inspection Inspection of the abdomen reveals - No Visible peristalsis and No Abnormal pulsations. Umbilicus - No Bleeding, No Urine drainage. Palpation/Percussion Palpation and Percussion of the abdomen reveal - Soft, Non Tender, No Rebound tenderness, No Rigidity (guarding) and No Cutaneous hyperesthesia. Note: obese but soft. Discomfort right upper quadrant greater than epigastric region. No definite Murphy sign. Mild discomfort just to the right inferior to the umbilicus. Above the inguinal region. No panniculitis. Not distended. No distasis recti. No umbilical or other anterior abdominal wall  hernias  Female Genitourinary Sexual Maturity Tanner 5 - Adult hair pattern. Note: No vaginal bleeding nor discharge  Peripheral Vascular Upper Extremity Inspection - Left - No Cyanotic nailbeds, Not Ischemic. Right - No Cyanotic nailbeds, Not Ischemic.  Neurologic Neurologic evaluation reveals -normal attention span and ability to concentrate, able to name objects and repeat phrases. Appropriate fund of knowledge , normal sensation and normal coordination. Mental Status Affect - not angry, not paranoid. Cranial Nerves-Normal Bilaterally. Gait-Normal.  Neuropsychiatric Mental status exam performed with findings of-able to articulate well with normal speech/language, rate, volume and coherence, thought content normal with ability to perform basic computations and apply abstract reasoning and no evidence of hallucinations, delusions, obsessions or homicidal/suicidal ideation. Note: tearful and anxious but consolable. Obviously frustrated  Musculoskeletal Global Assessment Spine, Ribs and Pelvis - no instability, subluxation or laxity. Right Upper Extremity - no instability, subluxation or laxity.  Lymphatic Head & Neck  General Head & Neck Lymphatics: Bilateral - Description - No Localized lymphadenopathy. Axillary  General Axillary Region: Bilateral - Description - No Localized lymphadenopathy. Femoral & Inguinal  Generalized Femoral & Inguinal Lymphatics: Left - Description - No Localized lymphadenopathy. Right - Description - No Localized lymphadenopathy.    Assessment & Plan Ardeth Sportsman MD; 05/03/2018 4:07 PM)  CHRONIC CHOLECYSTITIS WITH CALCULUS (K80.10) Impression: postprandial abdominal pain in right upper quadrant radiating to back with nausea. Some radiation to right lower quadrant/pelvis and epigastric region. Triggered by eating. While less intense and less often, still getting attacks even with much more lower fat food. Mild heartburn/reflux but not  explaining everything. no strong evidence of hepatitis or pancreatitis. No definite ulcer disease or bowel obstruction. Risks of the differential diagnosis seems unlikely.  Initially was skeptical but after a long discussion and history, her story is much more classic for biliary colic. I think she could benefit from cholecystectomy. Scopic outpatient surgery. No threshold to do core liver biopsy. Would allow me to look around to make sure there are no other surprises.she's been frustrated with her symptoms over the past month. I noted is okay to control her symptoms more aggressively with ice and heat in previous medications. That helps with her other issues. Continue low-fat diet. We'll work to try and get her gallbladder  out sooner.  I did caution there is a risk this d going onolve everything or that something else is going on. I eroldgy first.e option of having her see gastroenterology first. In sooner doing a trial of antacids and medications with her recollection of having a hiatal hernia in her having some symptoms improved on Protonix. Try OTC omeprazole. due for screening colonoscopy at age 63 anyway  Current Plans Pt Education - CCS Free Text Education/Instructions: discussed with patient and provided information. You are being scheduled for surgery- Our schedulers will call you.  You should hear from our office's scheduling department within 5 working days about the location, date, and time of surgery. We try to make accommodations for patient's preferences in scheduling surgery, but sometimes the OR schedule or the surgeon's schedule prevents [place] from making those accommodations.  If you have not heard from our office [phone]) in 5 working days, call the office and ask for your surgeon's nurse.  If you have other questions about your diagnosis, plan, or surgery, call the office and ask for your surgeon's nurse.  Written instructions provided Pt Education - Pamphlet Given -  Laparoscopic Gallbladder Surgery: discussed with patient and provided information. The anatomy & physiology of hepatobiliary & pancreatic function was discussed. The pathophysiology of gallbladder dysfunction was discussed. Natural history risks without surgery was discussed. I feel the risks of no intervention will lead to serious problems that outweigh the operative risks; therefore, I recommended cholecystectomy to remove the pathology. I explained laparoscopic techniques with possible need for an open approach. Probable cholangiogram to evaluate the bilary tract was explained as well.  Risks such as bleeding, infection, abscess, leak, injury to other organs, need for further treatment, heart attack, death, and other risks were discussed. I noted a good likelihood this will help address the problem. Possibility that this will not correct all abdominal symptoms was explained. Goals of post-operative recovery were discussed as well. We will work to minimize complications. An educational handout further explaining the pathology and treatment options was given as well. Questions were answered. The patient expresses understanding & wishes to proceed with surgery.  Pt Education - CCS Laparosopic Post Op HCI (Jeter Tomey) Pt Education - CCS Good Bowel Health ([place]) Pt Education - Laparoscopic Cholecystectomy: gallbladder  CHRONIC HEARTBURN (R12) Impression: some heartburn and possible history of hiatal hernia.  It is reasonable to do a trial of proton pump inhibitors for a few weeks to see if that helps with some of her burping and reflux issues. Consider seeing gastroenterology. She will think about it.   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  with her chronic right upper quadrant abdominal pain, low threshold to consider core needle biopsy of liver at the time  of cholecystectomy to rule out other issues.  Ardeth Sportsman, MD, FACS, MASCRS Gastrointestinal and Minimally Invasive Surgery    [address], [address] [place], [place] [address] [phone] Main / Paging [phone] Fax

## 2018-05-10 ENCOUNTER — Other Ambulatory Visit: Payer: Self-pay | Admitting: Obstetrics and Gynecology

## 2018-05-10 DIAGNOSIS — R2231 Localized swelling, mass and lump, right upper limb: Secondary | ICD-10-CM

## 2018-05-31 ENCOUNTER — Other Ambulatory Visit: Payer: Self-pay

## 2018-05-31 ENCOUNTER — Ambulatory Visit
Admission: RE | Admit: 2018-05-31 | Discharge: 2018-05-31 | Disposition: A | Discharge status: Home / Self Care | Payer: BLUE CROSS/BLUE SHIELD | Source: Ambulatory Visit | Attending: Obstetrics and Gynecology | Admitting: Obstetrics and Gynecology

## 2018-05-31 ENCOUNTER — Other Ambulatory Visit (HOSPITAL_COMMUNITY): Payer: BLUE CROSS/BLUE SHIELD

## 2018-05-31 ENCOUNTER — Encounter (HOSPITAL_COMMUNITY)
Admission: RE | Admit: 2018-05-31 | Discharge: 2018-05-31 | Disposition: A | Discharge status: Home / Self Care | Payer: BLUE CROSS/BLUE SHIELD | Source: Ambulatory Visit | Attending: Surgery | Admitting: Surgery

## 2018-05-31 ENCOUNTER — Encounter (HOSPITAL_COMMUNITY): Payer: Self-pay

## 2018-05-31 ENCOUNTER — Encounter (INDEPENDENT_AMBULATORY_CARE_PROVIDER_SITE_OTHER): Payer: Self-pay

## 2018-05-31 DIAGNOSIS — R2231 Localized swelling, mass and lump, right upper limb: Secondary | ICD-10-CM

## 2018-05-31 DIAGNOSIS — Z01818 Encounter for other preprocedural examination: Secondary | ICD-10-CM | POA: Diagnosis present

## 2018-05-31 LAB — BASIC METABOLIC PANEL
Anion gap: 8 (ref 5–15)
BUN: 9 mg/dL (ref 6–20)
CHLORIDE: 109 mmol/L (ref 98–111)
CO2: 23 mmol/L (ref 22–32)
CREATININE: 0.77 mg/dL (ref 0.44–1.00)
Calcium: 9.3 mg/dL (ref 8.9–10.3)
GFR calc Af Amer: >60 mL/min (ref >60)
GFR calc non Af Amer: >60 mL/min (ref >60)
GLUCOSE: 117 mg/dL — AB (ref 70–99)
POTASSIUM: 3.8 mmol/L (ref 3.5–5.1)
SODIUM: 140 mmol/L (ref 135–145)

## 2018-05-31 LAB — HEPATIC FUNCTION PANEL
ALK PHOS: 113 U/L (ref 38–126)
ALT: 12 U/L (ref 0–44)
AST: 17 U/L (ref 15–41)
Albumin: 4.1 g/dL (ref 3.5–5.0)
BILIRUBIN INDIRECT: 0.7 mg/dL (ref 0.3–0.9)
BILIRUBIN TOTAL: 0.8 mg/dL (ref 0.3–1.2)
Bilirubin, Direct: 0.1 mg/dL (ref 0.0–0.2)
TOTAL PROTEIN: 7.4 g/dL (ref 6.5–8.1)

## 2018-05-31 LAB — CBC
HEMATOCRIT: 42.7 % (ref 36.0–46.0)
HEMOGLOBIN: 13 g/dL (ref 12.0–15.0)
MCH: 25.8 pg — AB (ref 26.0–34.0)
MCHC: 30.4 g/dL (ref 30.0–36.0)
MCV: 84.9 fL (ref 80.0–100.0)
Platelets: 238 10*3/uL (ref 150–400)
RBC: 5.03 MIL/uL (ref 3.87–5.11)
RDW: 14.5 % (ref 11.5–15.5)
WBC: 4.9 10*3/uL (ref 4.0–10.5)
nRBC: 0 % (ref 0.0–0.2)

## 2018-05-31 NOTE — Progress Notes (Signed)
PCP -  Dr. Sandria Manly  LOV 04/2018  Cardiologist -   Chest x-ray -  EKG -  Feb. 17, 2020  Stress Test -  ECHO - ? Cardiac Cath -   Sleep Study - "yrs ago" CPAP - yes  (has been instructed to bring mask)  Fasting Blood Sugar - n/a   (gestational only)  Checks Blood Sugar _____ times a day  Blood Thinner Instructions: Aspirin Instructions:  Anesthesia review: yes  -- "functional murmur"   Revonda Standard interviewed)  Patient denies shortness of breath, fever, cough and chest pain at PAT appointment  I have requested from Dr. Ova Freshwater' office any and all notes from [facility] in [place], patients recent visit. Also sent fax requesting the same info from [facility].   Patient verbalized understanding of instructions that were given to them at the PAT appointment. Patient was also instructed that they will need to review over the PAT instructions again at home before surgery.

## 2018-05-31 NOTE — Progress Notes (Signed)
Anesthesia PAT Evaluation:  Case:  [id number] Date/Time:  06/03/18 0715   Procedures:      LAPAROSCOPIC CHOLECYSTECTOMY WITH INTRAOPERATIVE CHOLANGIOGRAM-SINGLE SITE (N/A )     SINGLE SITE LIVER BIOPSY AND POSSIBLE NEEDLE CORE LIVER BIOPSY (N/A )   Anesthesia type:  General   Pre-op diagnosis:  Chronic cholecystitis with gallstones.  Fatty liver change   [facility]:  [facility] 02 / [facility]   Surgeon:  Karie SodaGross, Steven, MD      DISCUSSION: Patient is a 53 year old female scheduled for the above procedure. I evaluated her at her 05/31/18 PAT visit.  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 BMI is consistent with obesity.   ED visit a few months ago at [facility] for right sided abdominal/pelvic pain. She reports that no cardiac etiology suspected. She also had RUE numbness with question of cervical spine issues at C5-6 and was told she would need future follow-up. She will still occasionally have RUE numbness at night. She walks nearly every day 30-60 minutes. There are days that she will get right abdominal pain and pelvic pain towards the end of her walk, but denied chest pain. In December she noticed some SOB, but reports overall improved. SOB then was not significant enough then to interfere with her usual activities or exercise. She denied LE edema. Reports history of "functional murmur." She believes she may have had an echo > 5 years ago in [place] that was okay.   Due to persistent intermittent right sided abdominal pains, her PCP Dr. Manus GunningEhinger referred her to general surgery because he wondered if gallstones could explain these symptoms. Dr. Michaell CowingGross felt her right sided abdominal symptoms could be explained by bilary colic and thought she  could benefit from cholecystectomy. It appears he also plans to look around intraoperatively for any other issues that could be contributing to her symptoms and will consider core liver biopsy. He also discussed that she is due to screening colonoscopy now that she is > 50. She has lived in [place] for about a year now, but previously lived in [place] and is having surgery here because family lives locally.   I did not appreciate a murmur on her exam at PAT. Awaiting [facility] from [facility].  She requests preoperative pregnancy test because although she reports she is "post-menopausal" she had breakthrough bleeding episode within the last six months.   UPDATE:  Records from [facility] reviewed (records sent by her PCP Blair HeysEhinger, Robert, MD) and included: - ED visit 04/01/18 for right sided facial, RUE, chest, and abdominal numbness with associated tingling in hand, intermittent right sided chest and abdominal pains, frequent belching, and some SOB. Chest discomfort was non-exertional and reproducible to palpation. She described SOB as feeling "anxious." Work-up included labs (BMET, HFP, CBC, d-dimer, troponin, UA, pregnancy test), CXR, EKG, head CT. Labs unremarkable with normal LFTs, WBC, and d-dimer. Negative troponin, pregnancy test. EKG without ST abnormality. CXR and head CT showed no acute abnormality. Neurology was consulted given her right sided paresthesias. Neurologist recommended MRI of the brain, cervical and thoracic spine to evaluate for possible multiple sclerosis. MRI brain showed non-specific white matter signal hyperintensities that can be seen in the setting of demyelinating disease, and c-spine MRI was thought to show at least mild demyelinating disease but results limited due to motion.  EEG was normal. She was discharged home. Patient reports she was told she would need future follow-up with possible re-imaging.  Reviewed history and records with anesthesiologist Oddono,  EJ, MD. Right sided pains thought be be from biliary colic. EKG unremarkable. METS > 4. RUE numbness work-up so far as outlined with plans for on-going evaluation in the near future. If no acute changes then it is anticipated that she can proceed as planned.     VS: BP (!) 150/85   Pulse 68   Temp 36.8 C   Resp 20   Ht 5' 1.5" (1.562 m)   Wt 85.1 kg   SpO2 100%   BMI 34.89 kg/m   Heart RRR, no murmur noted. Lungs clear. Good mouth opening.    PROVIDERS: Blair Heys, MD is PCP Maxie Better, MD is GYN   LABS: Labs reviewed: Acceptable for surgery. HFP added given posting including liver biopsy without known recent LFTs at the time of lab draw.    (all labs ordered are listed, but only abnormal results are displayed)  Labs Reviewed  CBC - Abnormal; Notable for the following components:      Result Value   MCH 25.8 (*)    All other components within normal limits  BASIC METABOLIC PANEL - Abnormal; Notable for the following components:   Glucose, Bld 117 (*)    All other components within normal limits  HEPATIC FUNCTION PANEL    IMAGES: Diagnostic right mammogram/US 05/31/18: IMPRESSION: 1. No mammographic or sonographic evidence of malignancy involving the RIGHT breast. 2. No pathologic RIGHT axillary lymphadenopathy. RECOMMENDATION: Annual BILATERAL screening mammography which is due in late April, 2020.  CXR 04/01/18 [facility]): Findings:  PA and lateral views of the chest were obtained. The cardiomediastinal silhouette is normal. The lungs are clear. No pleural effusions or pneumothorax. No acute osseous abnormality. Impression:  No acute cardiopulmonary abnormality.  MRI C-spine without/with contrast 04/01/18 [facility]): Findings:  Study sensitivity is degraded on the axial sequences due to persistent patient motion artifact. The sagittal sequences show some subtle T2/STIR signal hyperintensity in the cord particularly at the C5-6 level. There  is no abnormal post contrast enhancement. The cervical cord demonstrates some mild disk desiccation at multiple levels, but there is no significant central canal or neural foraminal stenosis. Review of the paraspinal soft tissues is unremarkable.  Impression:  Motion degraded exam, but there is felt to be at least mild demyelinating disease at the cervical core. No abnormal enhancement.   MRI T-spine without/with contrast 04/01/18 [facility]): Findings:  The visualized cervical cord is within normal limits. The posterior mediastinum and retroperitoneal soft tissue structures are normal. The thoracic cord is normal in signal, contour, and caliber. There is no pathologic cord or nerve root enhancement. There is mild midthoracic spondylosis. The paraspinous musculature is within normal limits. Impression: 1. Very mild midthoracic spondylosis. There is no evidence of cord compression or tumor. 2. No significant central canal or neural foraminal stenosis.  MRI brain without/with contrast 04/01/18 [facility]): Findings:  Anatomy at midline appears normal. There is no evidence of acute infarct or acute intracranial hemorrhage. No hydrocephalus, edema, midline shift or mass effect. There are a few scattered T2/FLAIR white matter signal hyperintensities, nonspecific. There is no abnormal postcontrast enhancement. Paranasal sinuses and mastoid air cells appear clear. Globes and orbits are intact. Impression: 1. No acute intracranial abnormality. 2. Scattered T2/FLAIR white matter signal hyperintensities, which are nonspecific, but can be seen in the setting of demyelinating  disease.  CT head 04/01/18 [facility]): Findings:  No acute infarct, intraparenchymal hemorrhage, or extra-axial collection. The ventricles are normal in caliber and the basal cisterns are patent. Cortical sulci are normal size and symmetry. No acute osseous abnormality. Paranasal sinuses and mastoid air cells are  clear. Impression: No acute intracranial abnormality.  EEG 04/01/18 [facility]): EEG Diagnosis: Normal EEG.   EKG: 2/04/20/18: NSR.    CV: She believes she had an echo in [place] > 5 years ago.   Past Medical History:  Diagnosis Date  . AMA (advanced maternal age) primigravida 35+ 08/26/2012  . Diabetes mellitus without complication (HCC)   . Dysrhythmia   . GDM, class A2 08/26/2012  . Gestational diabetes   . Heart murmur   . Hypothyroidism   . Hypothyroidism complicating pregnancy 08/26/2012  . Morbid obesity (HCC) 08/26/2012  . PONV (postoperative nausea and vomiting)   . Postpartum care following cesarean delivery (08/26/12) 08/26/2012  . Preeclampsia 08/26/2012  . Pregnancy induced hypertension   . Recurrent upper respiratory infection (URI)   . S/P primary low transverse C-section 08/26/2012  . Sleep apnea   . Thyroid disease     Past Surgical History:  Procedure Laterality Date  . BREAST EXCISIONAL BIOPSY Right   . BREAST SURGERY    . CESAREAN SECTION N/A 08/25/2012   Procedure: CESAREAN SECTION;  Surgeon: Serita Kyle, MD;  Location: [facility];  Service: Obstetrics;  Laterality: N/A;  . FRACTURE SURGERY     rt great toe-1990  AND HER LEFT SHOULDER  . OOPHORECTOMY  2005  . REDUCTION MAMMAPLASTY Bilateral 2003    MEDICATIONS: . acetaminophen (TYLENOL) 500 MG tablet  . ALPRAZolam (XANAX) 0.5 MG tablet  . cetirizine (ZYRTEC) 10 MG tablet  . Cholecalciferol (VITAMIN D) 50 MCG (2000 UT) CAPS  . docusate sodium (COLACE) 100 MG capsule  . ferrous sulfate 325 (65 FE) MG tablet  . hydrochlorothiazide (HYDRODIURIL) 50 MG tablet  . labetalol (NORMODYNE) 300 MG tablet  . levothyroxine (SYNTHROID, LEVOTHROID) 75 MCG tablet  . Multiple Vitamins-Minerals (AIRBORNE PO)  . oxyCODONE-acetaminophen (PERCOCET/ROXICET) 5-325 MG per tablet  . pantoprazole (PROTONIX) 40 MG tablet   No current facility-administered medications for this encounter.     Shonna Chock,  PA-C Surgical Short Stay/Anesthesiology [facility] Phone [phone] [facility] Phone [phone] 06/01/2018 11:51 AM

## 2018-05-31 NOTE — Pre-Procedure Instructions (Signed)
[name]  05/31/2018      [name] [facility] [address], [place] - [address] [name] [address] Phone: [phone] Fax: [phone]    Your procedure is scheduled on Thursday, Feb. 20th   Report to [facility] Admitting at 5:30 aM             (posted surgery time 7:30a - 9a)   Call this number if you have problems the morning of surgery:  [phone]   Remember:   Do not eat any foods or drink any liquids after midnight, Wednesday.              5 days prior to surgery, STOP TAKING any Vitamins, Herbal Supplements, Anti-inflammatories, Blood Thinners    Take these medicines the morning of surgery with A SIP OF WATER : Xanax, Lebothyroxine, Pantoprazole.    Do not wear jewelry, make-up or nail polish.  Do not wear lotions, powders, perfumes, or deodorant.  Do not shave 48 hours prior to surgery.    Do not bring valuables to the hospital.  [facility] is not responsible for any belongings or valuables.  Contacts, dentures or bridgework may not be worn into surgery.  Leave your suitcase in the car.  After surgery it may be brought to your room.  For patients admitted to the hospital, discharge time will be determined by your treatment team.  Patients discharged the day of surgery will not be allowed to drive home, and will need someone to stay with you for the first 24 hrs.  Please read over the following fact sheets that you were given. Surgical Site Infection Prevention      [facility]- Preparing For Surgery  Before surgery, you can play an important role. Because skin is not sterile, your skin needs to be as free of germs as possible. You can reduce the number of germs on your skin by washing with CHG (chlorahexidine gluconate) Soap before surgery.  CHG is an antiseptic cleaner which kills germs and bonds with the skin to continue killing germs even after washing.    Oral Hygiene is also  important to reduce your risk of infection.    Remember - BRUSH YOUR TEETH THE MORNING OF SURGERY WITH YOUR REGULAR TOOTHPASTE  Please do not use if you have an allergy to CHG or antibacterial soaps. If your skin becomes reddened/irritated stop using the CHG.  Do not shave (including legs and underarms) for at least 48 hours prior to first CHG shower. It is OK to shave your face.  Please follow these instructions carefully.   1. Shower the NIGHT BEFORE SURGERY and the MORNING OF SURGERY with CHG.   2. If you chose to wash your hair, wash your hair first as usual with your normal shampoo.  3. After you shampoo, rinse your hair and body thoroughly to remove the shampoo.  4. Use CHG as you would any other liquid soap. You can apply CHG directly to the skin and wash gently with a scrungie or a clean washcloth.   5. Apply the CHG Soap to your body ONLY FROM THE NECK DOWN.  Do not use on open wounds or open sores. Avoid contact with your eyes, ears, mouth and genitals (private parts). Wash Face and genitals (private parts)  with your normal soap.  6. Wash thoroughly, paying special attention to the area where your surgery will be performed.  7. Thoroughly rinse  your body with warm water from the neck down.  8. DO NOT shower/wash with your normal soap after using and rinsing off the CHG Soap.  9. Pat yourself dry with a CLEAN TOWEL.  10. Wear CLEAN PAJAMAS to bed the night before surgery, wear comfortable clothes the morning of surgery  11. Place CLEAN SHEETS on your bed the night of your first shower and DO NOT SLEEP WITH PETS.  Day of Surgery:  Do not apply any deodorants/lotions.  Please wear clean clothes to the hospital/surgery center.    Remember to brush your teeth WITH YOUR REGULAR TOOTHPASTE.

## 2018-06-01 NOTE — Anesthesia Preprocedure Evaluation (Addendum)
Anesthesia Evaluation  Patient identified by MRN, date of birth, ID band Patient awake    Reviewed: Allergy & Precautions, NPO status , Patient's Chart, lab work & pertinent test results, reviewed documented beta blocker date and time   History of Anesthesia Complications (+) PONV and history of anesthetic complications  Airway Mallampati: I       Dental no notable dental hx. (+) Teeth Intact   Pulmonary    Pulmonary exam normal breath sounds clear to auscultation       Cardiovascular hypertension, Pt. on medications and Pt. on home beta blockers Normal cardiovascular exam Rhythm:Regular Rate:Normal     Neuro/Psych    GI/Hepatic GERD  Medicated,  Endo/Other  diabetes, GestationalHypothyroidism   Renal/GU      Musculoskeletal   Abdominal Normal abdominal exam  (+) + obese,   Peds  Hematology   Anesthesia Other Findings   Reproductive/Obstetrics                           Anesthesia Physical Anesthesia Plan  ASA: II  Anesthesia Plan: General   Post-op Pain Management:    Induction: Intravenous  PONV Risk Score and Plan: 4 or greater and Ondansetron, Dexamethasone, Midazolam and Scopolamine patch - Pre-op  Airway Management Planned: Oral ETT  Additional Equipment:   Intra-op Plan:   Post-operative Plan: Extubation in OR  Informed Consent: I have reviewed the patients History and Physical, chart, labs and discussed the procedure including the risks, benefits and alternatives for the proposed anesthesia with the patient or authorized representative who has indicated his/her understanding and acceptance.     Dental advisory given  Plan Discussed with: CRNA  Anesthesia Plan Comments: (PAT note written by Shonna Chock, PA-C. See c-spine and brain MRI reports.  )      Anesthesia Quick Evaluation

## 2018-06-02 ENCOUNTER — Encounter (HOSPITAL_COMMUNITY): Payer: Self-pay | Admitting: Anesthesiology

## 2018-06-02 MED ORDER — GENTAMICIN SULFATE 40 MG/ML IJ SOLN
5.0000 mg/kg | INTRAVENOUS | Status: AC
  Administered 2018-06-03: 430 mg via INTRAVENOUS
  Filled 2018-06-02: qty 10.75

## 2018-06-02 MED ORDER — BUPIVACAINE LIPOSOME 1.3 % IJ SUSP
20.0000 mL | Freq: Once | INTRAMUSCULAR | Status: AC
  Administered 2018-06-03: 20 mL
  Filled 2018-06-02: qty 20

## 2018-06-02 MED ORDER — CLINDAMYCIN PHOSPHATE 900 MG/50ML IV SOLN
900.0000 mg | INTRAVENOUS | Status: AC
  Administered 2018-06-03: 900 mg via INTRAVENOUS
  Filled 2018-06-02: qty 50

## 2018-06-03 ENCOUNTER — Ambulatory Visit (HOSPITAL_COMMUNITY): Payer: BLUE CROSS/BLUE SHIELD | Admitting: Vascular Surgery

## 2018-06-03 ENCOUNTER — Ambulatory Visit (HOSPITAL_COMMUNITY)
Admission: RE | Admit: 2018-06-03 | Discharge: 2018-06-03 | Disposition: A | Discharge status: Home / Self Care | Payer: BLUE CROSS/BLUE SHIELD | Attending: Surgery | Admitting: Surgery

## 2018-06-03 ENCOUNTER — Ambulatory Visit (HOSPITAL_COMMUNITY): Payer: BLUE CROSS/BLUE SHIELD | Admitting: Anesthesiology

## 2018-06-03 ENCOUNTER — Other Ambulatory Visit: Payer: Self-pay

## 2018-06-03 ENCOUNTER — Ambulatory Visit (HOSPITAL_COMMUNITY): Payer: BLUE CROSS/BLUE SHIELD

## 2018-06-03 ENCOUNTER — Encounter (HOSPITAL_COMMUNITY): Payer: Self-pay

## 2018-06-03 ENCOUNTER — Encounter (HOSPITAL_COMMUNITY)
Admission: RE | Disposition: A | Discharge status: Home / Self Care | Payer: Self-pay | Source: Home / Self Care | Attending: Surgery

## 2018-06-03 DIAGNOSIS — Z886 Allergy status to analgesic agent status: Secondary | ICD-10-CM | POA: Insufficient documentation

## 2018-06-03 DIAGNOSIS — Z6835 Body mass index (BMI) 35.0-35.9, adult: Secondary | ICD-10-CM | POA: Diagnosis not present

## 2018-06-03 DIAGNOSIS — Z419 Encounter for procedure for purposes other than remedying health state, unspecified: Secondary | ICD-10-CM

## 2018-06-03 DIAGNOSIS — Z880 Allergy status to penicillin: Secondary | ICD-10-CM | POA: Diagnosis not present

## 2018-06-03 DIAGNOSIS — K449 Diaphragmatic hernia without obstruction or gangrene: Secondary | ICD-10-CM | POA: Diagnosis not present

## 2018-06-03 DIAGNOSIS — Z79899 Other long term (current) drug therapy: Secondary | ICD-10-CM | POA: Diagnosis not present

## 2018-06-03 DIAGNOSIS — K219 Gastro-esophageal reflux disease without esophagitis: Secondary | ICD-10-CM | POA: Insufficient documentation

## 2018-06-03 DIAGNOSIS — E039 Hypothyroidism, unspecified: Secondary | ICD-10-CM | POA: Insufficient documentation

## 2018-06-03 DIAGNOSIS — Z87898 Personal history of other specified conditions: Secondary | ICD-10-CM | POA: Insufficient documentation

## 2018-06-03 DIAGNOSIS — G4730 Sleep apnea, unspecified: Secondary | ICD-10-CM | POA: Insufficient documentation

## 2018-06-03 DIAGNOSIS — Z79890 Hormone replacement therapy (postmenopausal): Secondary | ICD-10-CM | POA: Insufficient documentation

## 2018-06-03 DIAGNOSIS — K760 Fatty (change of) liver, not elsewhere classified: Secondary | ICD-10-CM | POA: Diagnosis not present

## 2018-06-03 DIAGNOSIS — F419 Anxiety disorder, unspecified: Secondary | ICD-10-CM | POA: Insufficient documentation

## 2018-06-03 DIAGNOSIS — K8010 Calculus of gallbladder with chronic cholecystitis without obstruction: Secondary | ICD-10-CM | POA: Diagnosis not present

## 2018-06-03 DIAGNOSIS — R011 Cardiac murmur, unspecified: Secondary | ICD-10-CM | POA: Diagnosis not present

## 2018-06-03 HISTORY — DX: Calculus of gallbladder with chronic cholecystitis without obstruction: K80.10

## 2018-06-03 HISTORY — PX: LAPAROSCOPIC CHOLECYSTECTOMY SINGLE SITE WITH INTRAOPERATIVE CHOLANGIOGRAM: SHX6538

## 2018-06-03 HISTORY — PX: LIVER BIOPSY: SHX301

## 2018-06-03 HISTORY — DX: Fatty (change of) liver, not elsewhere classified: K76.0

## 2018-06-03 LAB — POCT PREGNANCY, URINE: Preg Test, Ur: NEGATIVE

## 2018-06-03 SURGERY — LAPAROSCOPIC CHOLECYSTECTOMY SINGLE SITE WITH INTRAOPERATIVE CHOLANGIOGRAM
Anesthesia: General | Site: Abdomen

## 2018-06-03 MED ORDER — STERILE WATER FOR IRRIGATION IR SOLN
Status: DC | PRN
  Administered 2018-06-03: 60 mL

## 2018-06-03 MED ORDER — ONDANSETRON HCL 4 MG/2ML IJ SOLN: 4.0000 mg | Freq: Once | INTRAMUSCULAR | Status: DC | PRN

## 2018-06-03 MED ORDER — HYDROMORPHONE HCL 1 MG/ML IJ SOLN
0.2500 mg | INTRAMUSCULAR | Status: DC | PRN
  Administered 2018-06-03 (×2): 0.5 mg via INTRAVENOUS

## 2018-06-03 MED ORDER — SCOPOLAMINE 1 MG/3DAYS TD PT72
MEDICATED_PATCH | TRANSDERMAL | Status: AC
  Filled 2018-06-03: qty 1

## 2018-06-03 MED ORDER — ACETAMINOPHEN 10 MG/ML IV SOLN: 1000.0000 mg | Freq: Once | INTRAVENOUS | Status: DC | PRN

## 2018-06-03 MED ORDER — OXYCODONE HCL 5 MG PO TABS
ORAL_TABLET | ORAL | Status: AC
  Filled 2018-06-03: qty 2

## 2018-06-03 MED ORDER — 0.9 % SODIUM CHLORIDE (POUR BTL) OPTIME
TOPICAL | Status: DC | PRN
  Administered 2018-06-03: 1000 mL

## 2018-06-03 MED ORDER — FENTANYL CITRATE (PF) 250 MCG/5ML IJ SOLN
INTRAMUSCULAR | Status: AC
  Filled 2018-06-03: qty 5

## 2018-06-03 MED ORDER — GABAPENTIN 300 MG PO CAPS
300.0000 mg | ORAL_CAPSULE | ORAL | Status: AC
  Administered 2018-06-03: 300 mg via ORAL

## 2018-06-03 MED ORDER — OXYCODONE HCL 5 MG PO TABS
5.0000 mg | ORAL_TABLET | ORAL | Status: DC | PRN
  Administered 2018-06-03: 10 mg via ORAL

## 2018-06-03 MED ORDER — MIDAZOLAM HCL 2 MG/2ML IJ SOLN
INTRAMUSCULAR | Status: DC | PRN
  Administered 2018-06-03: 2 mg via INTRAVENOUS

## 2018-06-03 MED ORDER — IOPAMIDOL (ISOVUE-300) INJECTION 61%
INTRAVENOUS | Status: AC
  Filled 2018-06-03: qty 50

## 2018-06-03 MED ORDER — LIDOCAINE 2% (20 MG/ML) 5 ML SYRINGE
INTRAMUSCULAR | Status: AC
  Filled 2018-06-03: qty 5

## 2018-06-03 MED ORDER — ONDANSETRON HCL 4 MG/2ML IJ SOLN
INTRAMUSCULAR | Status: AC
  Filled 2018-06-03: qty 2

## 2018-06-03 MED ORDER — MEPERIDINE HCL 50 MG/ML IJ SOLN: 6.2500 mg | INTRAMUSCULAR | Status: DC | PRN

## 2018-06-03 MED ORDER — PROPOFOL 10 MG/ML IV BOLUS
INTRAVENOUS | Status: AC
  Filled 2018-06-03: qty 20

## 2018-06-03 MED ORDER — ACETAMINOPHEN 650 MG RE SUPP: 650.0000 mg | RECTAL | Status: DC | PRN

## 2018-06-03 MED ORDER — KETAMINE HCL 50 MG/5ML IJ SOSY
PREFILLED_SYRINGE | INTRAMUSCULAR | Status: AC
  Filled 2018-06-03: qty 5

## 2018-06-03 MED ORDER — SUGAMMADEX SODIUM 200 MG/2ML IV SOLN
INTRAVENOUS | Status: DC | PRN
  Administered 2018-06-03: 168.8 mg via INTRAVENOUS

## 2018-06-03 MED ORDER — GLYCOPYRROLATE PF 0.2 MG/ML IJ SOSY
PREFILLED_SYRINGE | INTRAMUSCULAR | Status: AC
  Filled 2018-06-03: qty 1

## 2018-06-03 MED ORDER — DEXAMETHASONE SODIUM PHOSPHATE 10 MG/ML IJ SOLN
INTRAMUSCULAR | Status: DC | PRN
  Administered 2018-06-03: 5 mg via INTRAVENOUS

## 2018-06-03 MED ORDER — SODIUM CHLORIDE 0.9 % IV SOLN
INTRAVENOUS | Status: DC | PRN
  Administered 2018-06-03: 08:00:00

## 2018-06-03 MED ORDER — ACETAMINOPHEN 500 MG PO TABS
ORAL_TABLET | ORAL | Status: AC
  Administered 2018-06-03: 1000 mg via ORAL
  Filled 2018-06-03: qty 2

## 2018-06-03 MED ORDER — FENTANYL CITRATE (PF) 250 MCG/5ML IJ SOLN
INTRAMUSCULAR | Status: DC | PRN
  Administered 2018-06-03: 100 ug via INTRAVENOUS

## 2018-06-03 MED ORDER — GLYCOPYRROLATE PF 0.2 MG/ML IJ SOSY
PREFILLED_SYRINGE | INTRAMUSCULAR | Status: DC | PRN
  Administered 2018-06-03: .1 mg via INTRAVENOUS

## 2018-06-03 MED ORDER — CHLORHEXIDINE GLUCONATE CLOTH 2 % EX PADS: 6.0000 | MEDICATED_PAD | Freq: Once | CUTANEOUS | Status: DC

## 2018-06-03 MED ORDER — ACETAMINOPHEN 325 MG PO TABS: 650.0000 mg | ORAL_TABLET | ORAL | Status: DC | PRN

## 2018-06-03 MED ORDER — LIDOCAINE 2% (20 MG/ML) 5 ML SYRINGE
INTRAMUSCULAR | Status: DC | PRN
  Administered 2018-06-03: 100 mg via INTRAVENOUS

## 2018-06-03 MED ORDER — DEXMEDETOMIDINE HCL IN NACL 200 MCG/50ML IV SOLN
INTRAVENOUS | Status: DC | PRN
  Administered 2018-06-03: 8 ug via INTRAVENOUS

## 2018-06-03 MED ORDER — DEXAMETHASONE SODIUM PHOSPHATE 10 MG/ML IJ SOLN
INTRAMUSCULAR | Status: AC
  Filled 2018-06-03: qty 1

## 2018-06-03 MED ORDER — ROCURONIUM BROMIDE 10 MG/ML (PF) SYRINGE
PREFILLED_SYRINGE | INTRAVENOUS | Status: DC | PRN
  Administered 2018-06-03: 50 mg via INTRAVENOUS

## 2018-06-03 MED ORDER — HYDROMORPHONE HCL 1 MG/ML IJ SOLN
INTRAMUSCULAR | Status: AC
  Filled 2018-06-03: qty 1

## 2018-06-03 MED ORDER — ACETAMINOPHEN 500 MG PO TABS
1000.0000 mg | ORAL_TABLET | ORAL | Status: AC
  Administered 2018-06-03: 1000 mg via ORAL

## 2018-06-03 MED ORDER — SODIUM CHLORIDE 0.9 % IR SOLN
Status: DC | PRN
  Administered 2018-06-03: 1000 mL

## 2018-06-03 MED ORDER — PROPOFOL 500 MG/50ML IV EMUL
INTRAVENOUS | Status: AC
  Filled 2018-06-03: qty 50

## 2018-06-03 MED ORDER — GABAPENTIN 300 MG PO CAPS
ORAL_CAPSULE | ORAL | Status: AC
  Administered 2018-06-03: 300 mg via ORAL
  Filled 2018-06-03: qty 1

## 2018-06-03 MED ORDER — ROCURONIUM BROMIDE 50 MG/5ML IV SOSY
PREFILLED_SYRINGE | INTRAVENOUS | Status: AC
  Filled 2018-06-03: qty 5

## 2018-06-03 MED ORDER — OXYCODONE-ACETAMINOPHEN 5-325 MG PO TABS: 1.0000 | ORAL_TABLET | Freq: Four times a day (QID) | ORAL | 0 refills | Status: AC | PRN

## 2018-06-03 MED ORDER — PROPOFOL 10 MG/ML IV BOLUS
INTRAVENOUS | Status: DC | PRN
  Administered 2018-06-03: 150 mg via INTRAVENOUS

## 2018-06-03 MED ORDER — PROPOFOL 500 MG/50ML IV EMUL
INTRAVENOUS | Status: DC | PRN
  Administered 2018-06-03: 20 ug/kg/min via INTRAVENOUS

## 2018-06-03 MED ORDER — MIDAZOLAM HCL 2 MG/2ML IJ SOLN
INTRAMUSCULAR | Status: AC
  Filled 2018-06-03: qty 2

## 2018-06-03 MED ORDER — SCOPOLAMINE 1 MG/3DAYS TD PT72
MEDICATED_PATCH | TRANSDERMAL | Status: DC | PRN
  Administered 2018-06-03: 1 via TRANSDERMAL

## 2018-06-03 MED ORDER — LACTATED RINGERS IV SOLN
INTRAVENOUS | Status: DC | PRN
  Administered 2018-06-03: 07:00:00 via INTRAVENOUS

## 2018-06-03 MED ORDER — BUPIVACAINE-EPINEPHRINE 0.25% -1:200000 IJ SOLN
INTRAMUSCULAR | Status: DC | PRN
  Administered 2018-06-03: 30 mL

## 2018-06-03 MED ORDER — ONDANSETRON HCL 4 MG/2ML IJ SOLN
INTRAMUSCULAR | Status: DC | PRN
  Administered 2018-06-03: 4 mg via INTRAVENOUS

## 2018-06-03 MED ORDER — KETAMINE HCL 10 MG/ML IJ SOLN
INTRAMUSCULAR | Status: DC | PRN
  Administered 2018-06-03: 20 mg via INTRAVENOUS

## 2018-06-03 SURGICAL SUPPLY — 53 items
APPLIER CLIP 5 13 M/L LIGAMAX5 (MISCELLANEOUS) ×3
APPLIER CLIP ROT 10 11.4 M/L (STAPLE)
APR CLP MED LRG 11.4X10 (STAPLE)
APR CLP MED LRG 5 ANG JAW (MISCELLANEOUS) ×1
BAG SPEC RTRVL LRG 6X4 10 (ENDOMECHANICALS)
BLADE CLIPPER SURG (BLADE) IMPLANT
CANISTER SUCT 3000ML PPV (MISCELLANEOUS) ×3 IMPLANT
CHLORAPREP W/TINT 26ML (MISCELLANEOUS) ×3 IMPLANT
CLIP APPLIE 5 13 M/L LIGAMAX5 (MISCELLANEOUS) ×1 IMPLANT
CLIP APPLIE ROT 10 11.4 M/L (STAPLE) IMPLANT
COVER MAYO STAND STRL (DRAPES) ×3 IMPLANT
COVER SURGICAL LIGHT HANDLE (MISCELLANEOUS) ×3 IMPLANT
COVER WAND RF STERILE (DRAPES) ×1 IMPLANT
DRAPE C-ARM 42X72 X-RAY (DRAPES) ×3 IMPLANT
DRAPE WARM FLUID 44X44 (DRAPE) ×3 IMPLANT
DRSG TEGADERM 4X4.75 (GAUZE/BANDAGES/DRESSINGS) ×3 IMPLANT
DRSG TELFA 3X8 NADH (GAUZE/BANDAGES/DRESSINGS) ×3 IMPLANT
ELECT REM PT RETURN 9FT ADLT (ELECTROSURGICAL) ×3
ELECTRODE REM PT RTRN 9FT ADLT (ELECTROSURGICAL) ×1 IMPLANT
ENDOLOOP SUT PDS II  0 18 (SUTURE)
ENDOLOOP SUT PDS II 0 18 (SUTURE) IMPLANT
GAUZE SPONGE 2X2 8PLY STRL LF (GAUZE/BANDAGES/DRESSINGS) ×1 IMPLANT
GLOVE ECLIPSE 8.0 STRL XLNG CF (GLOVE) ×3 IMPLANT
GLOVE INDICATOR 8.0 STRL GRN (GLOVE) ×5 IMPLANT
GOWN STRL REUS W/ TWL LRG LVL3 (GOWN DISPOSABLE) ×2 IMPLANT
GOWN STRL REUS W/ TWL XL LVL3 (GOWN DISPOSABLE) ×1 IMPLANT
GOWN STRL REUS W/TWL LRG LVL3 (GOWN DISPOSABLE) ×9
GOWN STRL REUS W/TWL XL LVL3 (GOWN DISPOSABLE) ×3
KIT BASIN OR (CUSTOM PROCEDURE TRAY) ×3 IMPLANT
KIT TURNOVER KIT B (KITS) ×3 IMPLANT
NDL BIOPSY 14X6 SOFT TISS (NEEDLE) IMPLANT
NEEDLE 22X1 1/2 (OR ONLY) (NEEDLE) ×3 IMPLANT
NEEDLE BIOPSY 14X6 SOFT TISS (NEEDLE) ×3 IMPLANT
NS IRRIG 1000ML POUR BTL (IV SOLUTION) ×3 IMPLANT
PAD ARMBOARD 7.5X6 YLW CONV (MISCELLANEOUS) ×6 IMPLANT
PAD DRESSING TELFA 3X8 NADH (GAUZE/BANDAGES/DRESSINGS) IMPLANT
POUCH SPECIMEN RETRIEVAL 10MM (ENDOMECHANICALS) IMPLANT
SCISSORS LAP 5X35 DISP (ENDOMECHANICALS) ×3 IMPLANT
SET CHOLANGIOGRAPH 5 50 .035 (SET/KITS/TRAYS/PACK) ×3 IMPLANT
SET IRRIG TUBING LAPAROSCOPIC (IRRIGATION / IRRIGATOR) ×3 IMPLANT
SET TUBE SMOKE EVAC HIGH FLOW (TUBING) ×3 IMPLANT
SHEARS HARMONIC ACE PLUS 36CM (ENDOMECHANICALS) ×3 IMPLANT
SPECIMEN JAR SMALL (MISCELLANEOUS) ×5 IMPLANT
SPONGE GAUZE 2X2 STER 10/PKG (GAUZE/BANDAGES/DRESSINGS) ×2
SUT MNCRL AB 4-0 PS2 18 (SUTURE) ×3 IMPLANT
SUT PDS AB 1 CT  36 (SUTURE) ×4
SUT PDS AB 1 CT 36 (SUTURE) ×2 IMPLANT
SUT VICRYL 0 TIES 12 18 (SUTURE) IMPLANT
TOWEL OR 17X26 10 PK STRL BLUE (TOWEL DISPOSABLE) ×3 IMPLANT
TRAY LAPAROSCOPIC MC (CUSTOM PROCEDURE TRAY) ×3 IMPLANT
TROCAR 5M 150ML BLDLS (TROCAR) ×3 IMPLANT
TROCAR XCEL NON-BLD 5MMX100MML (ENDOMECHANICALS) ×3 IMPLANT
WATER STERILE IRR 1000ML POUR (IV SOLUTION) ×3 IMPLANT

## 2018-06-03 NOTE — Discharge Instructions (Signed)
LAPAROSCOPIC SURGERY: POST OP INSTRUCTIONS ° °###################################################################### ° °EAT °Gradually transition to a high fiber diet with a fiber supplement over the next few weeks after discharge.  Start with a pureed / full liquid diet (see below) ° °WALK °Walk an hour a day.  Control your pain to do that.   ° °CONTROL PAIN °Control pain so that you can walk, sleep, tolerate sneezing/coughing, go up/down stairs. ° °HAVE A BOWEL MOVEMENT DAILY °Keep your bowels regular to avoid problems.  OK to try a laxative to override constipation.  OK to use an antidairrheal to slow down diarrhea.  Call if not better after 2 tries ° °CALL IF YOU HAVE PROBLEMS/CONCERNS °Call if you are still struggling despite following these instructions. °Call if you have concerns not answered by these instructions ° °###################################################################### ° ° ° °1. DIET: Follow a light bland diet the first 24 hours after arrival home, such as soup, liquids, crackers, etc.  Be sure to include lots of fluids daily.  Avoid fast food or heavy meals as your are more likely to get nauseated.  Eat a low fat the next few days after surgery.   ° °2. Take your usually prescribed home medications unless otherwise directed. ° °3. PAIN CONTROL: °a. Pain is best controlled by a usual combination of three different methods TOGETHER: °i. Ice/Heat °ii. Over the counter pain medication °iii. Prescription pain medication °b. Most patients will experience some swelling and bruising around the incisions.  Ice packs or heating pads (30-60 minutes up to 6 times a day) will help. Use ice for the first few days to help decrease swelling and bruising, then switch to heat to help relax tight/sore spots and speed recovery.  Some people prefer to use ice alone, heat alone, alternating between ice & heat.  Experiment to what works for you.  Swelling and bruising can take several weeks to resolve.   °c. It  is helpful to take an over-the-counter pain medication regularly for the first few weeks.  Choose one of the following that works best for you: °i. Naproxen (Aleve, etc)  Two 220mg tabs twice a day °ii. Ibuprofen (Advil, etc) Three 200mg tabs four times a day (every meal & bedtime) °iii. Acetaminophen (Tylenol, etc) 500-650mg four times a day (every meal & bedtime) °d. A  prescription for pain medication (such as oxycodone, hydrocodone, tramadol, gabapentin, methocarbamol, etc) should be given to you upon discharge.  Take your pain medication as prescribed.  °i. If you are having problems/concerns with the prescription medicine (does not control pain, nausea, vomiting, rash, itching, etc), please call us [phone] to see if we need to switch you to a different pain medicine that will work better for you and/or control your side effect better. °ii. If you need a refill on your pain medication, please give us 48 hour notice.  contact your pharmacy.  They will contact our office to request authorization. Prescriptions will not be filled after 5 pm or on week-ends ° °4. Avoid getting constipated.   °a. Between the surgery and the pain medications, it is common to experience some constipation.   °b. Increasing fluid intake and taking a fiber supplement (such as Metamucil, Citrucel, FiberCon, MiraLax, etc) 1-2 times a day regularly will usually help prevent this problem from occurring.   °c. A mild laxative (prune juice, Milk of Magnesia, MiraLax, etc) should be taken according to package directions if there are no bowel movements after 48 hours.   °5. Watch out for   diarrhea.   °a. If you have many loose bowel movements, simplify your diet to bland foods & liquids for a few days.   °b. Stop any stool softeners and decrease your fiber supplement.   °c. Switching to mild anti-diarrheal medications (Kayopectate, Pepto Bismol) can help.   °d. If this worsens or does not improve, please call us. ° °6. Wash / shower every  day.  You may shower over the dressings as they are waterproof.  Continue to shower over incision(s) after the dressing is off. ° °7. Remove your waterproof bandages 5 days after surgery.  You may leave the incision open to air.  You may replace a dressing/Band-Aid to cover the incision for comfort if you wish.  ° °8. ACTIVITIES as tolerated:   °a. You may resume regular (light) daily activities beginning the next day--such as daily self-care, walking, climbing stairs--gradually increasing activities as tolerated.  If you can walk 30 minutes without difficulty, it is safe to try more intense activity such as jogging, treadmill, bicycling, low-impact aerobics, swimming, etc. °b. Save the most intensive and strenuous activity for last such as sit-ups, heavy lifting, contact sports, etc  Refrain from any heavy lifting or straining until you are off narcotics for pain control.   °c. DO NOT PUSH THROUGH PAIN.  Let pain be your guide: If it hurts to do something, don't do it.  Pain is your body warning you to avoid that activity for another week until the pain goes down. °d. You may drive when you are no longer taking prescription pain medication, you can comfortably wear a seatbelt, and you can safely maneuver your car and apply brakes. °e. You may have sexual intercourse when it is comfortable. ° °9. FOLLOW UP in our office °a. Please call [facility] at [phone] to set up an appointment to see your surgeon in the office for a follow-up appointment approximately 2-3 weeks after your surgery. °b. Make sure that you call for this appointment the day you arrive home to insure a convenient appointment time. ° °10. IF YOU HAVE DISABILITY OR FAMILY LEAVE FORMS, BRING THEM TO THE OFFICE FOR PROCESSING.  DO NOT GIVE THEM TO YOUR DOCTOR. ° ° °WHEN TO CALL US [phone]: °1. Poor pain control °2. Reactions / problems with new medications (rash/itching, nausea, etc)  °3. Fever over 101.5 F (38.5 C) °4. Inability to  urinate °5. Nausea and/or vomiting °6. Worsening swelling or bruising °7. Continued bleeding from incision. °8. Increased pain, redness, or drainage from the incision ° ° The clinic staff is available to answer your questions during regular business hours (8:30am-5pm).  Please don’t hesitate to call and ask to speak to one of our nurses for clinical concerns.  ° If you have a medical emergency, go to the nearest emergency room or call 911. ° A surgeon from [facility] is always on call at the hospitals ° ° °[facility] °[address], [address], [place], [place]  [address] ? °MAIN: [phone] ? TOLL FREE: [phone] ?  °FAX [phone] °[email or web address] ° ° ° ° ° °Cholecystitis ° °Cholecystitis is inflammation of the gallbladder. It is often called a gallbladder attack. The gallbladder is a pear-shaped organ that lies beneath the liver on the right side of the body. The gallbladder stores bile, which is a fluid that helps the body digest fats. If bile builds up in your gallbladder, your gallbladder becomes inflamed. °This condition may occur suddenly. Cholecystitis is   a serious condition and requires treatment. °What are the causes? °The most common cause of this condition is gallstones. Gallstones can block the tube (duct) that carries bile out of your gallbladder. This causes bile to build up. °Other causes include: °· Damage to the gallbladder due to a decrease in blood flow. °· Infections in the bile ducts. °· Scars or kinks in the bile ducts. °· Tumors in the liver, pancreas, or gallbladder. °What increases the risk? °You are more likely to develop this condition if: °· You have sickle cell disease. °· You take birth control pills or use estrogen. °· You have alcoholic liver disease. °· You have liver cirrhosis. °· You have your nutrition delivered through a vein (parenteral nutrition). °· You are critically ill. °· You do not eat or drink for a long  time. This is also called "fasting." °· You are obese. °· You lose weight too fast. °· You are pregnant. °· You have high levels of fat (triglycerides) in the blood. °· You have pancreatitis. °What are the signs or symptoms? °Symptoms of this condition include: °· Pain in the abdomen, especially in the upper right area of the abdomen. °· Tenderness or bloating in the abdomen. °· Nausea. °· Vomiting. °· Fever. °· Chills. °How is this diagnosed? °This condition is diagnosed with a medical history and physical exam. You may also have other tests, including: °· Imaging tests, such as: °? An ultrasound of the gallbladder. °? A CT scan of the abdomen. °? A gallbladder nuclear scan (HIDA scan). This scan allows your health care provider to see the bile moving from your liver to your gallbladder and on to your small intestine. °? MRI. °· Blood tests, such as: °? A complete blood count. The white blood cell count may be higher than normal. °? Liver function tests. Certain types of gallstones cause some results to be higher than normal. °How is this treated? °Treatment may include: °· Surgery to remove your gallbladder (cholecystectomy). °· Antibiotic medicine, usually through an IV. °· Fasting for a certain amount of time. °· Giving IV fluids. °· Medicine to treat pain or vomiting. °Follow these instructions at home: °· If you had surgery, follow instructions from your health care provider about home care after the procedure. °Medicines ° °· Take over-the-counter and prescription medicines only as told by your health care provider. °· If you were prescribed an antibiotic medicine, take it as told by your health care provider. Do not stop taking the antibiotic even if you start to feel better. °General instructions °· Follow instructions from your health care provider about what to eat or drink. When you are allowed to eat, avoid eating or drinking anything that triggers your symptoms. °· Do not lift anything that is heavier  than 10 lb (4.5 kg), or the limit that you are told, until your health care provider says that it is safe. °· Do not use any products that contain nicotine or tobacco, such as cigarettes and e-cigarettes. If you need help quitting, ask your health care provider. °· Keep all follow-up visits as told by your health care provider. This is important. °Contact a health care provider if: °· Your pain is not controlled with medicine. °· You have a fever. °Get help right away if: °· Your pain moves to another part of your abdomen or to your back. °· You continue to have symptoms or you develop new symptoms even with treatment. °Summary °· Cholecystitis is inflammation of the gallbladder. °· The most   common cause of this condition is gallstones. Gallstones can block the tube (duct) that carries bile out of your gallbladder. °· Common symptoms are pain in the abdomen, nausea, vomiting, fever, and chills. °· This condition is treated with surgery to remove the gallbladder, medicines, fasting, and IV fluids. °· Follow your health care provider's instructions for eating and drinking. Avoid eating anything that triggers your symptoms. °This information is not intended to replace advice given to you by your health care provider. Make sure you discuss any questions you have with your health care provider. °Document Released: 03/31/2005 Document Revised: 08/07/2017 Document Reviewed: 08/07/2017 °[employer]vier Interactive Patient Education © 2019 [employer] ° °

## 2018-06-03 NOTE — Anesthesia Postprocedure Evaluation (Signed)
Anesthesia Post Note  Patient: [name]  Procedure(s) Performed: LAPAROSCOPIC CHOLECYSTECTOMY WITH INTRAOPERATIVE CHOLANGIOGRAM-SINGLE SITE (N/A Abdomen) SINGLE SITE NEEDLE CORE LIVER BIOPSY (N/A Abdomen)     Patient location during evaluation: PACU Anesthesia Type: General Level of consciousness: awake and sedated Pain management: pain level controlled Vital Signs Assessment: post-procedure vital signs reviewed and stable Respiratory status: spontaneous breathing Cardiovascular status: stable Postop Assessment: no apparent nausea or vomiting Anesthetic complications: no    Last Vitals:  Vitals:   06/03/18 0921 06/03/18 0936  BP: 130/76 123/79  Pulse: 94 100  Resp: 18 17  Temp:    SpO2: 98% 98%    Last Pain:  Vitals:   06/03/18 0950  TempSrc:   PainSc: Asleep   Pain Goal:                   Caren Macadam

## 2018-06-03 NOTE — Interval H&P Note (Signed)
History and Physical Interval Note:  06/03/2018 7:14 AM  [name]  has presented today for surgery, with the diagnosis of Chronic cholecystitis with gallstones.  Fatty liver change  The various methods of treatment have been discussed with the patient and family. After consideration of risks, benefits and other options for treatment, the patient has consented to  Procedure(s): LAPAROSCOPIC CHOLECYSTECTOMY WITH INTRAOPERATIVE CHOLANGIOGRAM-SINGLE SITE (N/A) SINGLE SITE LIVER BIOPSY AND POSSIBLE NEEDLE CORE LIVER BIOPSY (N/A) as a surgical intervention .  The patient's history has been reviewed, patient examined, no change in status, stable for surgery.  I have reviewed the patient's chart and labs.  Questions were answered to the patient's satisfaction.    I have re-reviewed the the patient's records, history, medications, and allergies.  I have re-examined the patient.  I again discussed intraoperative plans and goals of post-operative recovery.  The patient agrees to proceed.  [name]  12/16/1965 [id number]  Patient Care Team: Blair Heys, MD as PCP - General (Family Medicine) Karie Soda, MD as Consulting Physician (General Surgery) Charlott Rakes, MD as Consulting Physician (Gastroenterology) Maxie Better, MD as Consulting Physician (Obstetrics and Gynecology)  There are no active problems to display for this patient.   Past Medical History:  Diagnosis Date  . AMA (advanced maternal age) primigravida 35+ 08/26/2012  . Chronic cholecystitis with calculus   . Dysrhythmia   . Fatty liver   . GDM, class A2 08/26/2012  . Gestational diabetes   . Heart murmur   . Hypothyroidism   . Hypothyroidism complicating pregnancy 08/26/2012  . Morbid obesity (HCC) 08/26/2012  . PONV (postoperative nausea and vomiting)   . Postpartum care following cesarean delivery (08/26/12) 08/26/2012  . Preeclampsia 08/26/2012  . Pregnancy induced hypertension   . Recurrent  upper respiratory infection (URI)   . S/P primary low transverse C-section 08/26/2012  . Sleep apnea   . Thyroid disease     Past Surgical History:  Procedure Laterality Date  . BREAST EXCISIONAL BIOPSY Right   . BREAST SURGERY    . CESAREAN SECTION N/A 08/25/2012   Procedure: CESAREAN SECTION;  Surgeon: Serita Kyle, MD;  Location: [facility];  Service: Obstetrics;  Laterality: N/A;  . FRACTURE SURGERY     rt great toe-1990  AND HER LEFT SHOULDER  . OOPHORECTOMY  2005  . REDUCTION MAMMAPLASTY Bilateral 2003    Social History   Socioeconomic History  . Marital status: Married    Spouse name: Not on file  . Number of children: Not on file  . Years of education: Not on file  . Highest education level: Not on file  Occupational History  . Not on file  Social Needs  . Financial resource strain: Not on file  . Food insecurity:    Worry: Not on file    Inability: Not on file  . Transportation needs:    Medical: Not on file    Non-medical: Not on file  Tobacco Use  . Smoking status: Never Smoker  . Smokeless tobacco: Never Used  Substance and Sexual Activity  . Alcohol use: No  . Drug use: No  . Sexual activity: Yes    Birth control/protection: None  Lifestyle  . Physical activity:    Days per week: Not on file    Minutes per session: Not on file  . Stress: Not on file  Relationships  . Social connections:    Talks on phone: Not on file    Gets together: Not on  file    Attends religious service: Not on file    Active member of club or organization: Not on file    Attends meetings of clubs or organizations: Not on file    Relationship status: Not on file  . Intimate partner violence:    Fear of current or ex partner: Not on file    Emotionally abused: Not on file    Physically abused: Not on file    Forced sexual activity: Not on file  Other Topics Concern  . Not on file  Social History Narrative  . Not on file    Family History  Problem Relation Age of  Onset  . Arthritis Mother   . Diabetes Mother   . Hyperlipidemia Mother   . Hypertension Mother   . Miscarriages / [place] Mother   . Arthritis Father   . Hyperlipidemia Father   . Breast cancer Maternal Aunt     Medications Prior to Admission  Medication Sig Dispense Refill Last Dose  . acetaminophen (TYLENOL) 500 MG tablet Take 500-1,000 mg by mouth every 6 (six) hours as needed for moderate pain.   Past Week at Unknown time  . ALPRAZolam (XANAX) 0.5 MG tablet Take 0.25-0.5 mg by mouth 2 (two) times daily as needed for anxiety.   06/02/2018 at Unknown time  . Cholecalciferol (VITAMIN D) 50 MCG (2000 UT) CAPS Take 2,000 Units by mouth daily.   Past Week at Unknown time  . levothyroxine (SYNTHROID, LEVOTHROID) 75 MCG tablet Take 75 mcg by mouth daily before breakfast.    06/02/2018 at Unknown time  . Multiple Vitamins-Minerals (AIRBORNE PO) Take 1 tablet by mouth 2 (two) times a week.   Past Week at Unknown time  . pantoprazole (PROTONIX) 40 MG tablet Take 40 mg by mouth daily.   06/02/2018 at Unknown time  . cetirizine (ZYRTEC) 10 MG tablet Take 10 mg by mouth daily as needed for allergies.    Unknown at Unknown time  . docusate sodium (COLACE) 100 MG capsule Take 1 capsule (100 mg total) by mouth 3 (three) times daily as needed. (Patient not taking: Reported on 05/19/2018) 60 capsule 2 Not Taking at Unknown time  . ferrous sulfate 325 (65 FE) MG tablet Take 1 tablet (325 mg total) by mouth 2 (two) times daily with a meal. (Patient not taking: Reported on 05/19/2018) 60 tablet 1 Not Taking at Unknown time  . hydrochlorothiazide (HYDRODIURIL) 50 MG tablet Take 1 tablet (50 mg total) by mouth daily. (Patient not taking: Reported on 05/19/2018) 5 tablet 0 Not Taking at Unknown time  . labetalol (NORMODYNE) 300 MG tablet Take 1 tablet (300 mg total) by mouth every 8 (eight) hours. (Patient not taking: Reported on 05/19/2018) 42 tablet 0 Not Taking at Unknown time  . oxyCODONE-acetaminophen  (PERCOCET/ROXICET) 5-325 MG per tablet Take 1-2 tablets by mouth every 4 (four) hours as needed. (Patient not taking: Reported on 05/19/2018) 40 tablet 0 Not Taking at Unknown time    Current Facility-Administered Medications  Medication Dose Route Frequency Provider Last Rate Last Dose  . bupivacaine liposome (EXPAREL) 1.3 % injection 266 mg  20 mL Infiltration Once Karie Soda, MD      . Chlorhexidine Gluconate Cloth 2 % PADS 6 each  6 each Topical Once Karie Soda, MD       And  . Chlorhexidine Gluconate Cloth 2 % PADS 6 each  6 each Topical Once Karie Soda, MD      . clindamycin (CLEOCIN) IVPB 900  mg  900 mg Intravenous On Call to OR Karie Soda, MD       And  . gentamicin (GARAMYCIN) 430 mg in dextrose 5 % 100 mL IVPB  5 mg/kg Intravenous To Gwenlyn Saran, MD         Allergies  Allergen Reactions  . Aspirin Hives  . Penicillins Hives    Did it involve swelling of the face/tongue/throat, SOB, or low BP? Unknown Did it involve sudden or severe rash/hives, skin peeling, or any reaction on the inside of your mouth or nose? No Did you need to seek medical attention at a hospital or doctor's office? No When did it last happen?Childhood and adult allergy If all above answers are "NO", may proceed with cephalosporin use.     BP 128/69   Pulse 88   Temp 98.2 F (36.8 C) (Oral)   Resp 20   Ht 5' 1.5" (1.562 m)   Wt 84.4 kg   SpO2 100%   BMI 34.58 kg/m   Labs: Results for orders placed or performed during the hospital encounter of 06/03/18 (from the past 48 hour(s))  Pregnancy, urine POC     Status: None   Collection Time: 06/03/18  6:08 AM  Result Value Ref Range   Preg Test, Ur NEGATIVE NEGATIVE    Comment:        THE SENSITIVITY OF THIS METHODOLOGY IS >24 mIU/mL     Imaging / Studies: US Breast Ltd Uni Right Inc Axilla  Result Date: 05/31/2018 CLINICAL DATA:  53 year old who underwent remote BILATERAL reduction mammoplasty, presenting with  possible palpable lumps in the axillary tail RIGHT breast/low RIGHT axilla and the OUTER RIGHT breast. Patient states that she had fatty tissue removed from the RIGHT axilla at the time of the reduction mammoplasty. EXAM: DIGITAL DIAGNOSTIC RIGHT MAMMOGRAM WITH CAD AND TOMO ULTRASOUND RIGHT BREAST COMPARISON:  Previous exam(s). ACR Breast Density Category b: There are scattered areas of fibroglandular density. FINDINGS: Tomosynthesis and synthesized full field CC and MLO views of the RIGHT breast were obtained. Tomosynthesis and synthesized spot compression tangential view of the areas of concern in the RIGHT breast and low RIGHT axilla were also obtained. No mammographic abnormality in the area of concern in the OUTER breast at POSTERIOR depth. Surgical scar and normal scattered fibroglandular tissue is present in this location. No mammographic abnormality in the area of concern in the axillary tail/low RIGHT axilla. Surgical scar and normal-appearing low RIGHT axillary lymph nodes are present in this location. No findings suspicious for malignancy in the RIGHT breast. Mammographic images were processed with CAD. On physical exam, there is a scar in the low RIGHT axilla related to the prior resection of fatty tissue at the time of the reduction mammoplasty. There is no palpable mass or lymphadenopathy in the RIGHT axilla. The patient is tender to palpation in the OUTER RIGHT breast, though I do not palpate a discrete mass. Targeted RIGHT breast ultrasound is performed, showing normal scattered fibroglandular tissue at the 8:30 o'clock position approximately 10 cm from the nipple in the area of palpable concern. No cyst, solid mass or abnormal acoustic shadowing is identified. Targeted low RIGHT axillary ultrasound is performed, showing normal lymph nodes and normal subcutaneous fat. IMPRESSION: 1. No mammographic or sonographic evidence of malignancy involving the RIGHT breast. 2. No pathologic RIGHT axillary  lymphadenopathy. RECOMMENDATION: Annual BILATERAL screening mammography which is due in late April, 2020. I have discussed the findings and recommendations with the patient. Results were also  provided in writing at the conclusion of the visit. If applicable, a reminder letter will be sent to the patient regarding the next appointment. BI-RADS CATEGORY  2: Benign. Electronically Signed   By: Hulan Saashomas  Lawrence M.D.   On: 05/31/2018 14:56   Mm Diag Breast Tomo Uni Right  Result Date: 05/31/2018 CLINICAL DATA:  53 year old who underwent remote BILATERAL reduction mammoplasty, presenting with possible palpable lumps in the axillary tail RIGHT breast/low RIGHT axilla and the OUTER RIGHT breast. Patient states that she had fatty tissue removed from the RIGHT axilla at the time of the reduction mammoplasty. EXAM: DIGITAL DIAGNOSTIC RIGHT MAMMOGRAM WITH CAD AND TOMO ULTRASOUND RIGHT BREAST COMPARISON:  Previous exam(s). ACR Breast Density Category b: There are scattered areas of fibroglandular density. FINDINGS: Tomosynthesis and synthesized full field CC and MLO views of the RIGHT breast were obtained. Tomosynthesis and synthesized spot compression tangential view of the areas of concern in the RIGHT breast and low RIGHT axilla were also obtained. No mammographic abnormality in the area of concern in the OUTER breast at POSTERIOR depth. Surgical scar and normal scattered fibroglandular tissue is present in this location. No mammographic abnormality in the area of concern in the axillary tail/low RIGHT axilla. Surgical scar and normal-appearing low RIGHT axillary lymph nodes are present in this location. No findings suspicious for malignancy in the RIGHT breast. Mammographic images were processed with CAD. On physical exam, there is a scar in the low RIGHT axilla related to the prior resection of fatty tissue at the time of the reduction mammoplasty. There is no palpable mass or lymphadenopathy in the RIGHT axilla. The  patient is tender to palpation in the OUTER RIGHT breast, though I do not palpate a discrete mass. Targeted RIGHT breast ultrasound is performed, showing normal scattered fibroglandular tissue at the 8:30 o'clock position approximately 10 cm from the nipple in the area of palpable concern. No cyst, solid mass or abnormal acoustic shadowing is identified. Targeted low RIGHT axillary ultrasound is performed, showing normal lymph nodes and normal subcutaneous fat. IMPRESSION: 1. No mammographic or sonographic evidence of malignancy involving the RIGHT breast. 2. No pathologic RIGHT axillary lymphadenopathy. RECOMMENDATION: Annual BILATERAL screening mammography which is due in late April, 2020. I have discussed the findings and recommendations with the patient. Results were also provided in writing at the conclusion of the visit. If applicable, a reminder letter will be sent to the patient regarding the next appointment. BI-RADS CATEGORY  2: Benign. Electronically Signed   By: Hulan Saashomas  Lawrence M.D.   On: 05/31/2018 14:56     .Ardeth SportsmanSteven C. Lanny Donoso, M.D., F.A.C.S. Gastrointestinal and Minimally Invasive Surgery [facility] [address] [place], [place] [id number] [phone] Main / Paging  06/03/2018 7:14 AM    Ardeth SportsmanSteven C Nilay Mangrum

## 2018-06-03 NOTE — Op Note (Signed)
06/03/2018  PATIENT:  [name]  53 y.o. female  Patient Care Team: Blair Heys, MD as PCP - General (Family Medicine) Karie Soda, MD as Consulting Physician (General Surgery) Charlott Rakes, MD as Consulting Physician (Gastroenterology) Maxie Better, MD as Consulting Physician (Obstetrics and Gynecology)  PRE-OPERATIVE DIAGNOSIS:    Chronic Calculus cholecystitis  POST-OPERATIVE DIAGNOSIS:   Chronic Calculus cholecystitis  Liver: Fatty steatohepatitis  PROCEDURE:  Core Liver Biopsy & SINGLE SITE Laparoscopic cholecystectomy with intraoperative cholangiogram  SURGEON:  Ardeth Sportsman, MD, FACS.  ASSISTANT: OR Staff   ANESTHESIA:    General with endotracheal intubation Local anesthetic as a field block  EBL:  (See Anesthesia Intraoperative Record) Total I/O In: 800 [I.V.:800] Out: 10 [Blood:10]  Delay start of Pharmacological VTE agent (>24hrs) due to surgical blood loss or risk of bleeding:  no  DRAINS: None   SPECIMEN: Gallbladder & Core liver biopsies    DISPOSITION OF SPECIMEN:  PATHOLOGY  COUNTS:  YES  PLAN OF CARE: Discharge to home after PACU  PATIENT DISPOSITION:  PACU - hemodynamically stable.  INDICATION: Pleasant obese woman with intermittent attacks of upper abdominal pain.  Not consistent with heartburn or reflux or other differential dental.  History of gallstones.  Recurrent symptoms.  Some question of fatty change in liver.  I offered laparoscopic cholecystectomy with cholangiogram and possible liver biopsy  The anatomy & physiology of hepatobiliary & pancreatic function was discussed.  The pathophysiology of gallbladder dysfunction was discussed.  Natural history risks without surgery was discussed.   I feel the risks of no intervention will lead to serious problems that outweigh the operative risks; therefore, I recommended cholecystectomy to remove the pathology.  I explained laparoscopic techniques with possible need for  an open approach.  Probable cholangiogram to evaluate the bilary tract was explained as well.    Risks such as bleeding, infection, abscess, leak, injury to other organs, need for further treatment, heart attack, death, and other risks were discussed.  I noted a good likelihood this will help address the problem.  Possibility that this will not correct all abdominal symptoms was explained.  Goals of post-operative recovery were discussed as well.  We will work to minimize complications.  An educational handout further explaining the pathology and treatment options was given as well.  Questions were answered.  The patient expresses understanding & wishes to proceed with surgery.  OR FINDINGS: Boggy thin-walled gallbladder.  Few small stones.  Very narrowed fibrotic proximal cystic duct.  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  Some moderate some mild fatty change on the liver suspicious for steatohepatitis.  No strong evidence cirrhosis.  No other intra-abdominal abnormalities  DESCRIPTION:   The patient was identified & brought in the operating room. The patient was positioned supine with arms tucked. SCDs were active during the entire case. The patient underwent general anesthesia without any difficulty.  The abdomen was prepped and draped in a sterile fashion. A Surgical Timeout confirmed our plan.  I made a transverse curvilinear incision through the superior umbilical fold.  I placed a 17mm long port through the supraumbilical fascia using a modified Hassan cutdown technique with umbilical stalk fascial countertraction. I began carbon dioxide insufflation.  No change in end tidal CO2 measurement.   Camera inspection revealed no injury. There were no adhesions to the anterior abdominal wall supraumbilically.  I proceeded to continue with single site technique. I placed a #5 port in left upper aspect of the wound. I placed  a 5 mm atraumatic grasper in the right inferior aspect  of the wound.  I turned attention to the right upper quadrant.  Gallbladder was not inflamed or edematous.  However was quite floppy and atonic.  Liver had some thickening to it but no strong evidence of cirrhosis.  The gallbladder fundus was elevated cephalad. I freed adhesions to the ventral surface of the gallbladder off carefully.  I freed the peritoneal coverings between the gallbladder and the liver on the posteriolateral and anteriomedial walls.  Interestingly, it already had a window between the gallbladder and liver bed that provided a natural critical view already.  I alternated between Harmonic & blunt Maryland dissection to help get a good critical view of the cystic artery and cystic duct.  did further dissection to free 50%of the gallbladder off the liver bed to get a good critical view of the infundibulum and cystic duct. I dissected out the cystic artery; and, after getting a good 360 view, ligated the anterior & posterior branches of the cystic artery close on the infundibulum using the Harmonic ultrasonic dissection.  I skeletonized the cystic duct.  I placed a clip on the infundibulum. I did a partial cystic duct-otomy and ensured patency.  It was fibrotic in the proximal and middle cystic duct, so ended up having to repeat cystic ductotomy more proximally.  I placed a 5 [place] cholangiocatheter through a puncture site at the right subcostal ridge of the abdominal wall and directed it into the cystic duct.  We ran a cholangiogram with dilute radio-opaque contrast and continuous fluoroscopy. Contrast flowed from a side branch consistent with cystic duct cannulization. Contrast flowed up the common hepatic duct into the right and left intrahepatic chains out to secondary radicals. Contrast flowed down the common bile duct easily across the normal ampulla into the duodenum.  Biliary system was rather narrow but not pathologically slow.  No pearls on a string sign or stricturing.  No leak.  This  was consistent with a normal cholangiogram.  I removed the cholangiocatheter. I placed clips on the cystic duct x4.  I completed cystic duct transection. I freed the gallbladder from its remaining attachments to the liver. I ensured hemostasis on the gallbladder fossa of the liver and elsewhere.  Because of the gallbladder and somewhat underwhelming in the liver not perfectly normal, decide to go him proceed with core liver biopsies.  I used a 14-gauge Tru-Cut needle biopsy through the right subcostal puncture site of the cholangiogram.  I did 4 passes and got to decent into fair cores total.  I ensured hemostasis I inspected the rest of the abdomen & detected no injury nor bleeding elsewhere.  I removed the gallbladder out the supraumbilical fascia. I closed the fascia transversely using #1 PDS interrupted stitches. I closed the skin using 4-0 monocryl stitch.  Sterile dressing was applied. The patient was extubated & arrived in the PACU in stable condition..  I had discussed postoperative care with the patient in the holding area. I discussed operative findings, updated the patient's status, discussed probable steps to recovery, and gave postoperative recommendations to the patient's spouse.  Recommendations were made.  Questions were answered.  He expressed understanding & appreciation.  Ardeth Sportsman, M.D., F.A.C.S. Gastrointestinal and Minimally Invasive Surgery [facility] [address], [address] [place], [place] [address] [phone] Main / Paging  06/03/2018 8:50 AM

## 2018-06-03 NOTE — H&P (Signed)
[name] DOB: June 28, 1965 Married / Language: [place] / Race: Black or African American Female  Patient Care Team: Blair Heys, MD as PCP - General (Family Medicine) Karie Soda, MD as Consulting Physician (General Surgery) Charlott Rakes, MD as Consulting Physician (Gastroenterology) Maxie Better, MD as Consulting Physician (Obstetrics and Gynecology)  Marland Kitchen Patient sent for surgical consultation at the request of Dr Manus Gunning  Chief Complaint: abdominal pain and gallstones. ` ` The patient is a movements had intermittent abdominal pains. Episode in 2015 that confirmed gallstones by ultrasound. Recently had an episode of upper abdominal pain. Somewhat right-sided. This is when she was living down in [place]. Ultrasound confirmed gallstones. On another issues as well, including some neurological weakness. An extensive neurological workup including MRI, CT scan, EEG. I believe that workup was negative. Return to town with many concerns and complaints to her primary care physician. He obtain records. Dr Manus Gunning wondered if the gallstones could be explainning some of her issues. Surgical consultation requested.  patient comes in today with her husband. Frustrated. She recalls an episode after a heavy meal in mid December. They were trying a keto diet which was heavy in protein and fat. Had upper abdominal pain. Especially right upper quadrant. Related to flank. Felt nauseated. Some back pain. Recurrent symptoms. It's cured her. She'll most on she's having a heart attack. Feeling some numbness. There is discussion about getting an ultrasound that she had worsening symptoms, serve primary care physician sent her to the emergency room. As noted above, neurological workup for MS, stroke, etc. and otherwise negative. Since that more severe attack she still has been getting intermittent pains. She now has a chronic achiness in her right upper  quadrant. They switch to a low-fat diet couple weeks ago. The attacks have gone down but she still struggles with some discomfort. She recalls being told she had a hiatal hernia. He was given a prescription for Protonix but she shouldn't helped somewhat. Did not stop the attacks. 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 No history of kidney stones or kidney infections or UTIs. No vaginal bleeding or discharge. Normally moves her bowels every days. No history of ulcer blood disorder inflammatory bowel disease. No irritable bowel. No family history of issues. She does have some history of anxiety on Xanax when she was younger. She wonders if the tingling in her hands and chest pressure discomfort was more anxiety since cardiac and neurologic workup was otherwise negative.  No personal nor family history of GI/colon cancer, inflammatory bowel disease, irritable bowel syndrome, allergy such as Celiac Sprue, dietary/dairy problems, colitis, ulcers nor gastritis. No recent sick contacts/gastroenteritis. No travel outside the country. No changes in diet. No dysphagia to solids or liquids. No hematochezia, hematemesis, coffee ground emesis. No evidence of prior gastric/peptic ulceration. She felt tired after walking for an hour but was able to do the walk. She does not smoke. She is nondiabetic.  (Review of systems as stated in this history (HPI) or in the review of systems. Otherwise all other 12 point ROS are negative) ` ` `   Past Surgical History Ethlyn Gallery, CMA; 05/03/2018 3:07 PM) Cesarean Section - 1 Mammoplasty; Reduction Bilateral. Oral Surgery  Diagnostic Studies History Ethlyn Gallery, CMA; 05/03/2018 3:07 PM) Colonoscopy never Mammogram within last year Pap Smear 1-5 years ago  Allergies Ethlyn Gallery, CMA; 05/03/2018 3:08 PM) Aspirin 81 *ANALGESICS - NonNarcotic* Penicillin G Benzathine  & Proc *PENICILLINS*  Medication History Ethlyn Gallery, CMA; 05/03/2018 3:09 PM) Levothyroxine Sodium ( Tablet, Oral) Active. Vitamin D (Cholecalciferol) (1000UNIT Capsule, Oral) Active. Pantoprazole Sodium (20MG  Tablet DR, Oral) Active. ALPRAZolam (0.5MG  Tablet, Oral) Active. Medications Reconciled  Social History Ethlyn Gallery, CMA; 05/03/2018 3:07 PM) Caffeine use Carbonated beverages, Coffee, Tea. No alcohol use No drug use Tobacco use Never smoker.  Family History Ethlyn Gallery, CMA; 05/03/2018 3:07 PM) Anesthetic complications Mother. Cerebrovascular Accident Father. Diabetes Mellitus Mother. Hypertension Mother.  Pregnancy / Birth History Ethlyn Gallery, CMA; 05/03/2018 3:07 PM) Age at menarche 13 years. Age of menopause 26-50 Contraceptive History Oral contraceptives. Gravida 1 Maternal age >57 Para 1 Regular periods  Other Problems Ethlyn Gallery, CMA; 05/03/2018 3:07 PM) Anxiety Disorder Gastroesophageal Reflux Disease Heart murmur Hemorrhoids Oophorectomy Bilateral. Sleep Apnea Thyroid Disease     Review of Systems (Alisha Spillers CMA; 05/03/2018 3:07 PM) General Present- Fatigue. Not Present- Appetite Loss, Chills, Fever, Night Sweats, Weight Gain and Weight Loss. HEENT Present- Wears glasses/contact lenses. Not Present- Earache, Hearing Loss, Hoarseness, Nose Bleed, Oral Ulcers, Ringing in the Ears, Seasonal Allergies, Sinus Pain, Sore Throat, Visual Disturbances and Yellow Eyes. Respiratory Present- Snoring. Not Present- Bloody sputum, Chronic Cough, Difficulty Breathing and Wheezing. Breast Not Present- Breast Mass, Breast Pain, Nipple Discharge and Skin Changes. Cardiovascular Present- Shortness of Breath. Not Present- Chest Pain, Difficulty Breathing Lying Down, Leg Cramps, Palpitations, Rapid Heart Rate and Swelling of Extremities. Gastrointestinal Present- Abdominal Pain, Indigestion and Nausea. Not  Present- Bloating, Bloody Stool, Change in Bowel Habits, Chronic diarrhea, Constipation, Difficulty Swallowing, Excessive gas, Gets full quickly at meals, Hemorrhoids, Rectal Pain and Vomiting. Female Genitourinary Present- Pelvic Pain. Not Present- Frequency, Nocturia, Painful Urination and Urgency. Musculoskeletal Present- Back Pain and Muscle Weakness. Not Present- Joint Pain, Joint Stiffness, Muscle Pain and Swelling of Extremities. Psychiatric Present- Anxiety and Change in Sleep Pattern. Not Present- Bipolar, Depression, Fearful and Frequent crying. Hematology Not Present- Blood Thinners, Easy Bruising, Excessive bleeding, Gland problems, HIV and Persistent Infections.  Vitals (Alisha Spillers CMA; 05/03/2018 3:08 PM) 05/03/2018 3:07 PM Weight: 190 lb Height: 61.5in Body Surface Area: 1.86 m Body Mass Index: 35.32 kg/m  Pulse: 88 (Regular)  BP: 146/78 (Sitting, Left Arm, Standard)   BP 128/69   Pulse 88   Temp 98.2 F (36.8 C) (Oral)   Resp 20   Ht 5' 1.5" (1.562 m)   Wt 84.4 kg   SpO2 100%   BMI 34.58 kg/m     Physical Exam Ardeth Sportsman MD; 05/03/2018 3:59 PM)  General Mental Status-Alert. General Appearance-Not in acute distress, Not Sickly. Orientation-Oriented X3. Hydration-Well hydrated. Voice-Normal. Note: Moves around normally. Not toxic. Not sickly.  Integumentary Global Assessment Upon inspection and palpation of skin surfaces of the - Axillae: non-tender, no inflammation or ulceration, no drainage. and Distribution of scalp and body hair is normal. General Characteristics Temperature - normal warmth is noted.  Head and Neck Head-normocephalic, atraumatic with no lesions or palpable masses. Face Global Assessment - atraumatic, no absence of expression. Neck Global Assessment - no abnormal movements, no bruit auscultated on the right, no bruit auscultated on the left, no decreased range of motion,  non-tender. Trachea-midline. Thyroid Gland Characteristics - non-tender.  Eye Eyeball - Left-Extraocular movements intact, No Nystagmus. Eyeball - Right-Extraocular movements intact, No Nystagmus. Cornea - Left-No Hazy. Cornea - Right-No Hazy. Sclera/Conjunctiva - Left-No scleral icterus, No Discharge. Sclera/Conjunctiva - Right-No scleral icterus, No Discharge. Pupil - Left-Direct reaction to light normal. Pupil - Right-Direct reaction to light normal.  ENMT Ears Pinna - Left - no drainage observed, no generalized tenderness observed. Right - no drainage observed, no generalized tenderness observed. Nose and Sinuses External Inspection of the Nose - no destructive lesion observed. Inspection of the nares - Left - quiet respiration. Right - quiet respiration. Mouth and Throat Lips - Upper Lip - no fissures observed, no pallor noted. Lower Lip - no fissures observed, no pallor noted. Nasopharynx - no discharge present. Oral Cavity/Oropharynx - Tongue - no dryness observed. Oral Mucosa - no cyanosis observed. Hypopharynx - no evidence of airway distress observed.  Chest and Lung Exam Inspection Movements - Normal and Symmetrical. Accessory muscles - No use of accessory muscles in breathing. Palpation Palpation of the chest reveals - Non-tender. Auscultation Breath sounds - Normal and Clear.  Cardiovascular Auscultation Rhythm - Regular. Murmurs & Other Heart Sounds - Auscultation of the heart reveals - No Murmurs and No Systolic Clicks.  Abdomen Inspection Inspection of the abdomen reveals - No Visible peristalsis and No Abnormal pulsations. Umbilicus - No Bleeding, No Urine drainage. Palpation/Percussion Palpation and Percussion of the abdomen reveal - Soft, Non Tender, No Rebound tenderness, No Rigidity (guarding) and No Cutaneous hyperesthesia. Note: obese but soft. Discomfort right upper quadrant greater than epigastric region. No definite Murphy  sign. Mild discomfort just to the right inferior to the umbilicus. Above the inguinal region. No panniculitis. Not distended. No distasis recti. No umbilical or other anterior abdominal wall hernias  Female Genitourinary Sexual Maturity Tanner 5 - Adult hair pattern. Note: No vaginal bleeding nor discharge  Peripheral Vascular Upper Extremity Inspection - Left - No Cyanotic nailbeds, Not Ischemic. Right - No Cyanotic nailbeds, Not Ischemic.  Neurologic Neurologic evaluation reveals -normal attention span and ability to concentrate, able to name objects and repeat phrases. Appropriate fund of knowledge , normal sensation and normal coordination. Mental Status Affect - not angry, not paranoid. Cranial Nerves-Normal Bilaterally. Gait-Normal.  Neuropsychiatric Mental status exam performed with findings of-able to articulate well with normal speech/language, rate, volume and coherence, thought content normal with ability to perform basic computations and apply abstract reasoning and no evidence of hallucinations, delusions, obsessions or homicidal/suicidal ideation. Note: tearful and anxious but consolable. Obviously frustrated  Musculoskeletal Global Assessment Spine, Ribs and Pelvis - no instability, subluxation or laxity. Right Upper Extremity - no instability, subluxation or laxity.  Lymphatic Head & Neck  General Head & Neck Lymphatics: Bilateral - Description - No Localized lymphadenopathy. Axillary  General Axillary Region: Bilateral - Description - No Localized lymphadenopathy. Femoral & Inguinal  Generalized Femoral & Inguinal Lymphatics: Left - Description - No Localized lymphadenopathy. Right - Description - No Localized lymphadenopathy.    Assessment & Plan  CHRONIC CHOLECYSTITIS WITH CALCULUS (K80.10) Impression: postprandial abdominal pain in right upper quadrant radiating to back with nausea. Some radiation to right lower quadrant/pelvis  and epigastric region. Triggered by eating. While less intense and less often, still getting attacks even with much more lower fat food. Mild heartburn/reflux but not explaining everything. no strong evidence of hepatitis or pancreatitis. No definite ulcer disease or bowel obstruction. Risks of the differential diagnosis seems unlikely.  Initially was skeptical but after a long discussion and history, her story is much more classic for biliary colic. I think she could benefit from cholecystectomy. Scopic outpatient surgery. No threshold to do core liver biopsy. Would allow me to look around to make sure there are no other surprises.she's been frustrated with her symptoms over the past month. I noted is okay to  control her symptoms more aggressively with ice and heat in previous medications. That helps with her other issues. Continue low-fat diet. We'll work to try and get her gallbladder out sooner.  I did caution there is a risk this will not solve everything or that something else is going on. I offered option of having her see gastroenterology first. Do trial of antacids and medications with her recollection of having a hiatal hernia in her having some symptoms improved on Protonix. Try OTC omeprazole. due for screening colonoscopy at age 75 anyway  Pt Education - Pamphlet Given - Laparoscopic Gallbladder Surgery: discussed with patient and provided information. The anatomy & physiology of hepatobiliary & pancreatic function was discussed. The pathophysiology of gallbladder dysfunction was discussed. Natural history risks without surgery was discussed. I feel the risks of no intervention will lead to serious problems that outweigh the operative risks; therefore, I recommended cholecystectomy to remove the pathology. I explained laparoscopic techniques with possible need for an open approach. Probable cholangiogram to evaluate the bilary tract was explained as well.  Risks such as bleeding,  infection, abscess, leak, injury to other organs, need for further treatment, heart attack, death, and other risks were discussed. I noted a good likelihood this will help address the problem. Possibility that this will not correct all abdominal symptoms was explained. Goals of post-operative recovery were discussed as well. We will work to minimize complications. An educational handout further explaining the pathology and treatment options was given as well. Questions were answered. The patient expresses understanding & wishes to proceed with surgery.  Pt Education - CCS Laparosopic Post Op HCI ([place] Thresher) Pt Education - [facility] Good Bowel Health (Emmabelle Fear) Pt Education - Laparoscopic Cholecystectomy: gallbladder  CHRONIC HEARTBURN (R12) Impression: some heartburn and possible history of hiatal hernia.  It is reasonable to do a trial of proton pump inhibitors for a few weeks to see if that helps with some of her burping and reflux issues. Consider seeing gastroenterology. She will think about it.   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  with her chronic right upper quadrant abdominal pain, low threshold to consider core needle biopsy of liver at the time of cholecystectomy to rule out other issues.  Ardeth Sportsman, MD, FACS, MASCRS Gastrointestinal and Minimally Invasive Surgery    [address], [address] [place], [place] [address] [phone] Main / Paging [phone] Fax

## 2018-06-03 NOTE — Transfer of Care (Signed)
Immediate Anesthesia Transfer of Care Note  Patient: [name]  Procedure(s) Performed: LAPAROSCOPIC CHOLECYSTECTOMY WITH INTRAOPERATIVE CHOLANGIOGRAM-SINGLE SITE (N/A Abdomen) SINGLE SITE NEEDLE CORE LIVER BIOPSY (N/A Abdomen)  Patient Location: PACU  Anesthesia Type:General  Level of Consciousness: awake, alert  and oriented  Airway & Oxygen Therapy: Patient Spontanous Breathing and Patient connected to face mask oxygen  Post-op Assessment: Report given to RN and Post -op Vital signs reviewed and stable  Post vital signs: Reviewed and stable  Last Vitals:  Vitals Value Taken Time  BP 127/77 06/03/2018  9:06 AM  Temp 36.2 C 06/03/2018  9:06 AM  Pulse 96 06/03/2018  9:20 AM  Resp 20 06/03/2018  9:20 AM  SpO2 98 % 06/03/2018  9:20 AM  Vitals shown include unvalidated device data.  Last Pain:  Vitals:   06/03/18 0906  TempSrc:   PainSc: Asleep         Complications: No apparent anesthesia complications

## 2018-06-03 NOTE — Anesthesia Procedure Notes (Signed)
Procedure Name: Intubation Date/Time: 06/03/2018 7:40 AM Performed by: Mayer Camel, CRNA Pre-anesthesia Checklist: Patient identified, Emergency Drugs available, Suction available, Patient being monitored and Timeout performed Patient Re-evaluated:Patient Re-evaluated prior to induction Oxygen Delivery Method: Circle system utilized Preoxygenation: Pre-oxygenation with 100% oxygen Induction Type: IV induction Ventilation: Mask ventilation without difficulty Laryngoscope Size: Miller and 2 Grade View: Grade I Tube type: Oral Tube size: 7.0 mm Number of attempts: 1 Airway Equipment and Method: Oral airway Placement Confirmation: positive ETCO2,  ETT inserted through vocal cords under direct vision and breath sounds checked- equal and bilateral Secured at: 21 cm Tube secured with: Tape Dental Injury: Teeth and Oropharynx as per pre-operative assessment

## 2018-06-04 ENCOUNTER — Encounter (HOSPITAL_COMMUNITY): Payer: Self-pay | Admitting: Surgery

## 2020-04-18 IMAGING — MG DIGITAL DIAGNOSTIC UNILATERAL RIGHT MAMMOGRAM WITH TOMO AND CAD
8 series · 8 of 24 positions shown · non-contrast
Comparison: Previous exam(s).

CLINICAL DATA: 52-year-old who underwent remote BILATERAL reduction
mammoplasty, presenting with possible palpable lumps in the axillary
tail RIGHT breast/low RIGHT axilla and the OUTER RIGHT breast.
Patient states that she had fatty tissue removed from the RIGHT
axilla at the time of the reduction mammoplasty.

EXAM:
DIGITAL DIAGNOSTIC RIGHT MAMMOGRAM WITH CAD AND TOMO
ULTRASOUND RIGHT BREAST

[R MLO synth-2D (1 of 2)]
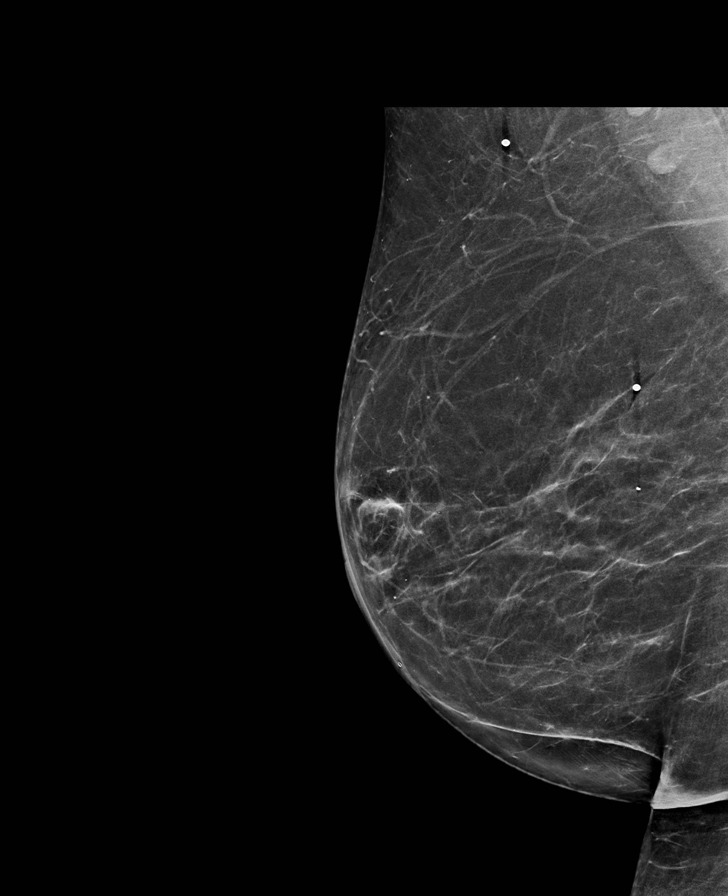

[R MLO synth-2D (2 of 2)]
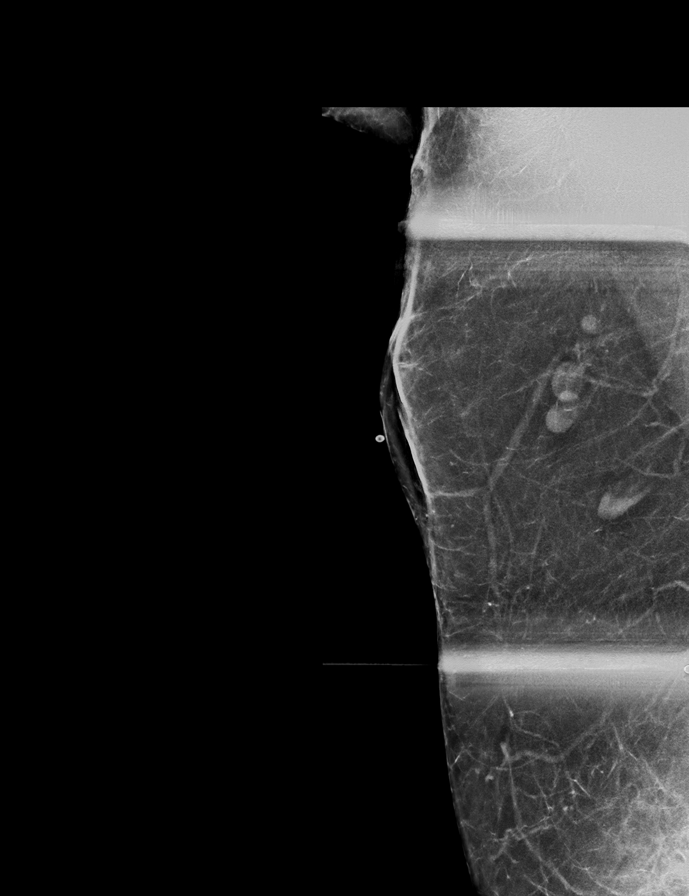

[R CC synth-2D (1 of 2)]
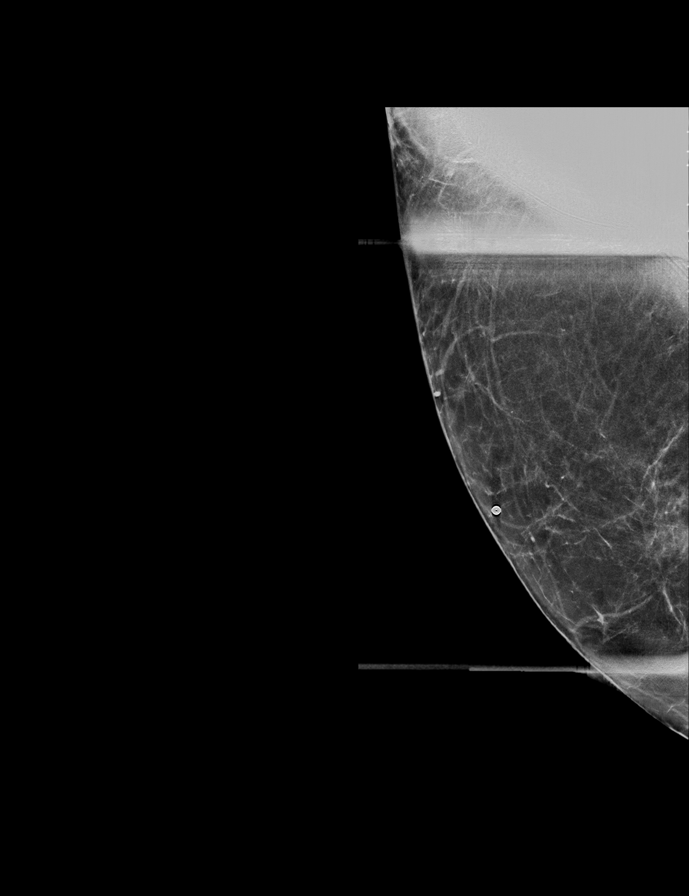

[R CC synth-2D (2 of 2)]
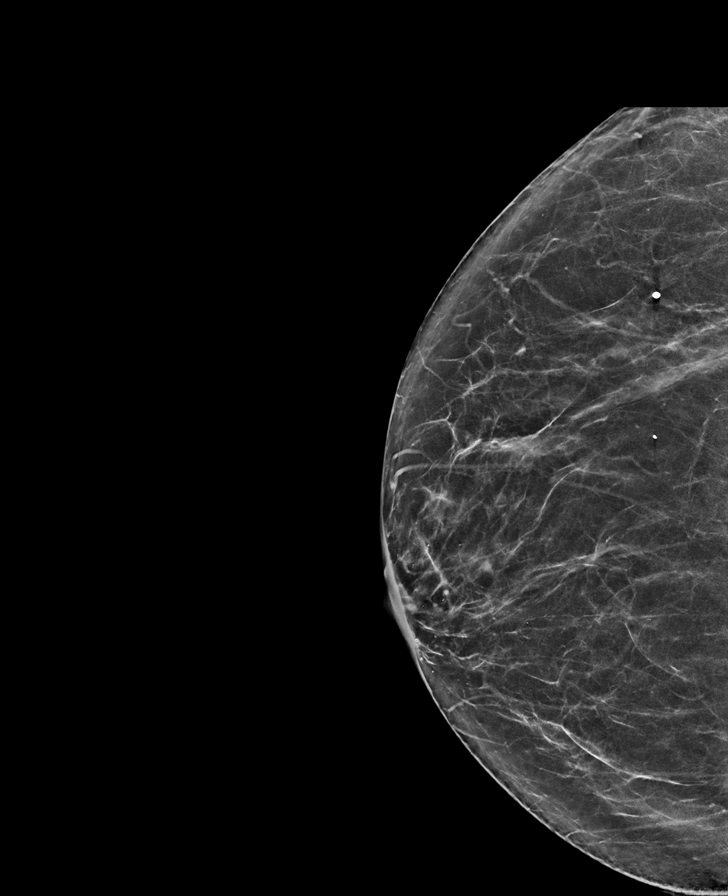

[R CC tomo (1 of 2) · tomo slice 27/54.0]
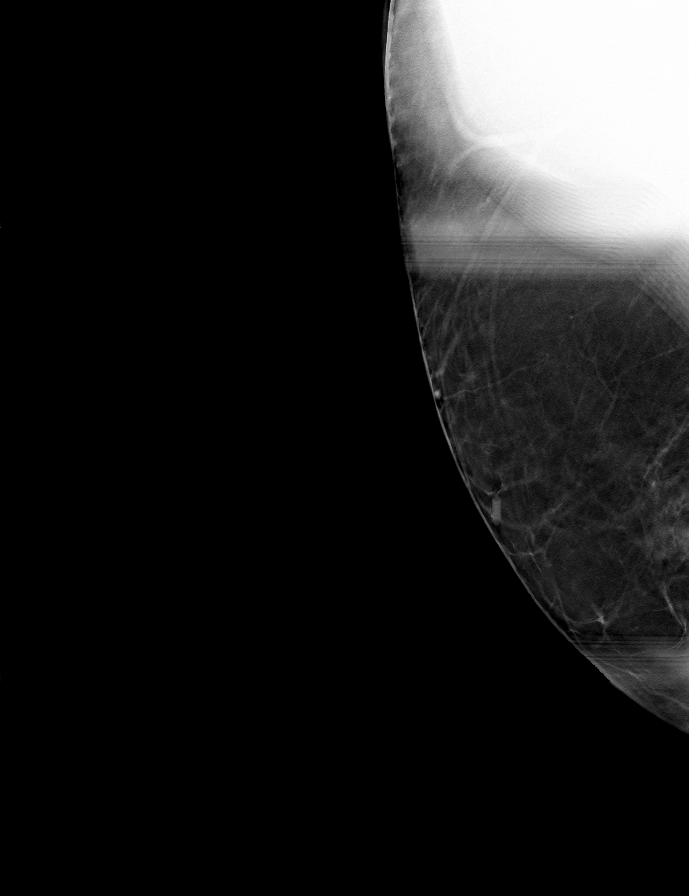

[R CC tomo (2 of 2) · tomo slice 35/70.0]
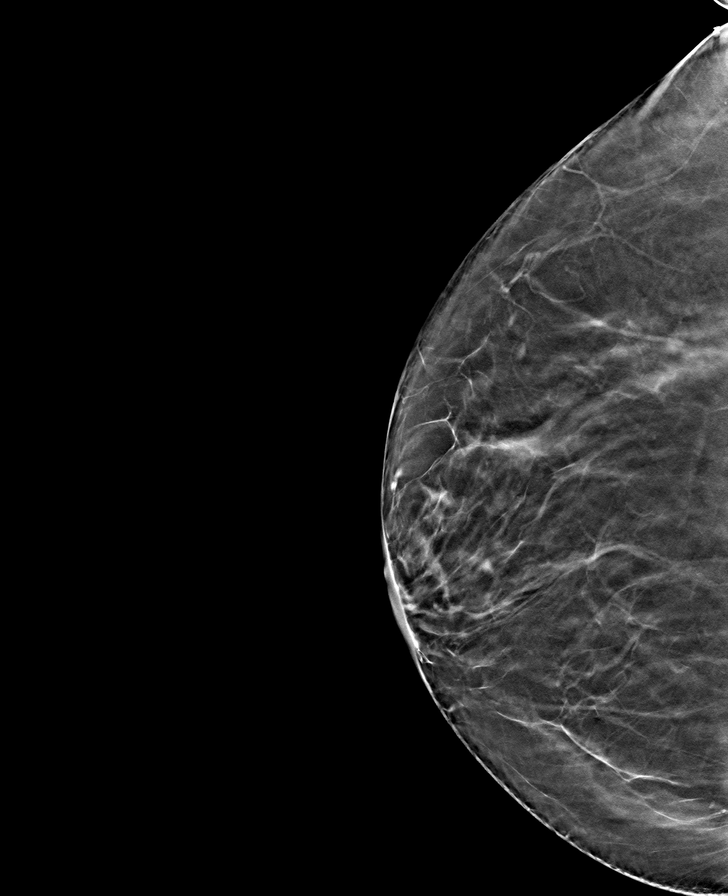

[R MLO tomo (1 of 2) · tomo slice 36/71.0]
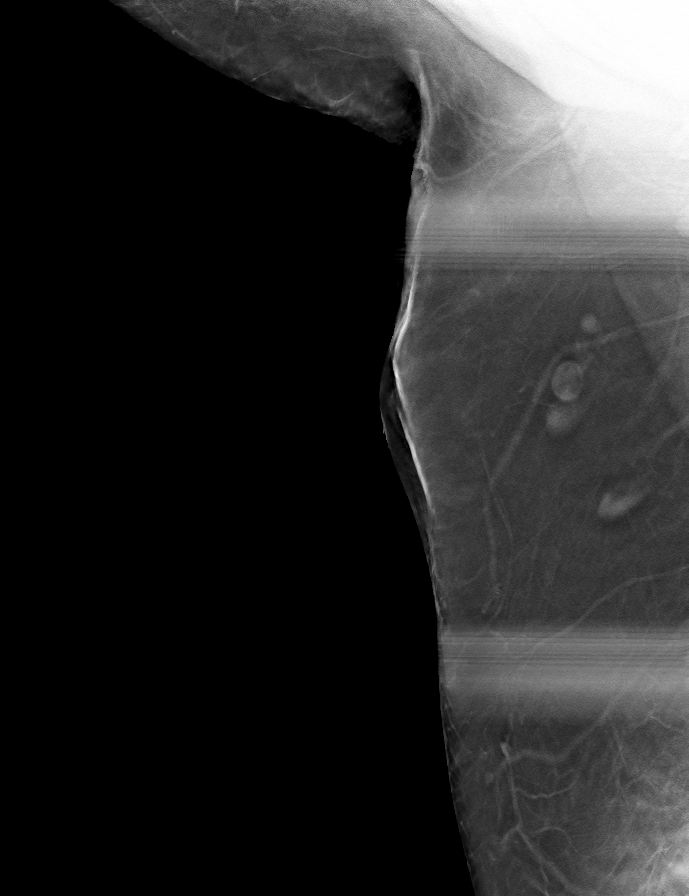

[R MLO tomo (2 of 2) · tomo slice 38/75.0]
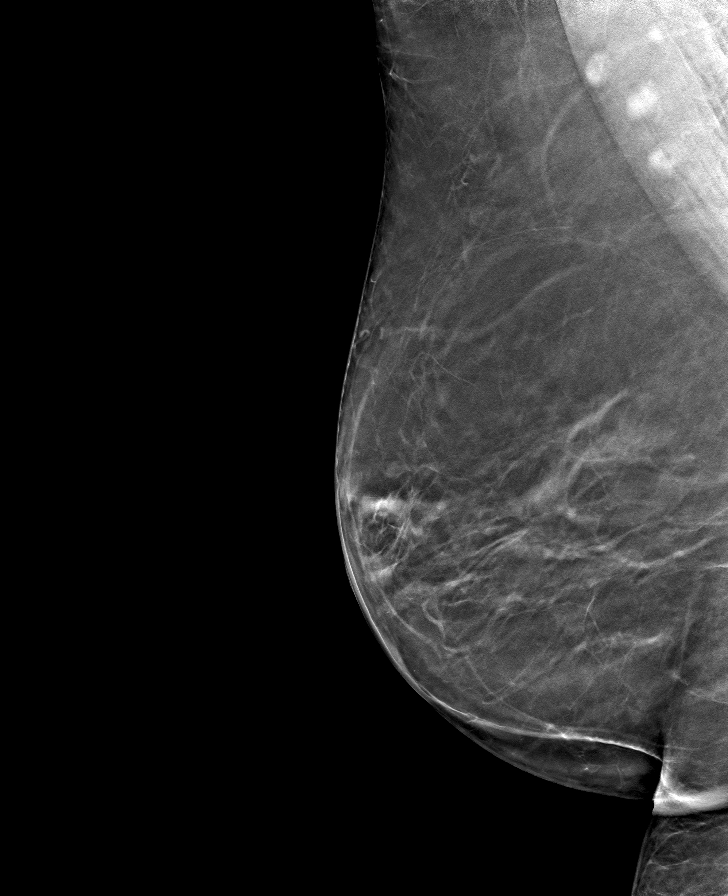

[8 of 24 positions shown; findings below may reference images not displayed]

ACR Breast Density Category b: There are scattered areas of
fibroglandular density.
FINDINGS: Tomosynthesis and synthesized full field CC and MLO views of the
RIGHT breast were obtained. Tomosynthesis and synthesized spot
compression tangential view of the areas of concern in the RIGHT
breast and low RIGHT axilla were also obtained.

No mammographic abnormality in the area of concern in the OUTER
breast at POSTERIOR depth. Surgical scar and normal scattered
fibroglandular tissue is present in this location.

No mammographic abnormality in the area of concern in the axillary
tail/low RIGHT axilla. Surgical scar and normal-appearing low RIGHT
axillary lymph nodes are present in this location.

No findings suspicious for malignancy in the RIGHT breast.

Mammographic images were processed with CAD.

On physical exam, there is a scar in the low RIGHT axilla related to
the prior resection of fatty tissue at the time of the reduction
mammoplasty. There is no palpable mass or lymphadenopathy in the
RIGHT axilla. The patient is tender to palpation in the OUTER RIGHT
breast, though I do not palpate a discrete mass.

Targeted RIGHT breast ultrasound is performed, showing normal
scattered fibroglandular tissue at the 8:30 o'clock position
approximately 10 cm from the nipple in the area of palpable concern.
No cyst, solid mass or abnormal acoustic shadowing is identified.

Targeted low RIGHT axillary ultrasound is performed, showing normal
lymph nodes and normal subcutaneous fat.
IMPRESSION: 1. No mammographic or sonographic evidence of malignancy involving
the RIGHT breast.
2. No pathologic RIGHT axillary lymphadenopathy.

RECOMMENDATION:
Annual BILATERAL screening mammography which is due in late July 2018.

I have discussed the findings and recommendations with the patient.
Results were also provided in writing at the conclusion of the
visit. If applicable, a reminder letter will be sent to the patient
regarding the next appointment.

BI-RADS CATEGORY  2: Benign.

## 2020-04-18 IMAGING — US ULTRASOUND RIGHT BREAST LIMITED
1 series · 5 of 5 positions shown · non-contrast
Comparison: Previous exam(s).

CLINICAL DATA: 52-year-old who underwent remote BILATERAL reduction
mammoplasty, presenting with possible palpable lumps in the axillary
tail RIGHT breast/low RIGHT axilla and the OUTER RIGHT breast.
Patient states that she had fatty tissue removed from the RIGHT
axilla at the time of the reduction mammoplasty.

EXAM:
DIGITAL DIAGNOSTIC RIGHT MAMMOGRAM WITH CAD AND TOMO
ULTRASOUND RIGHT BREAST

[Series 1: ultrasound right breast limited · 0.06mm/px · 5 of 5 slices shown]
[im 1/5]
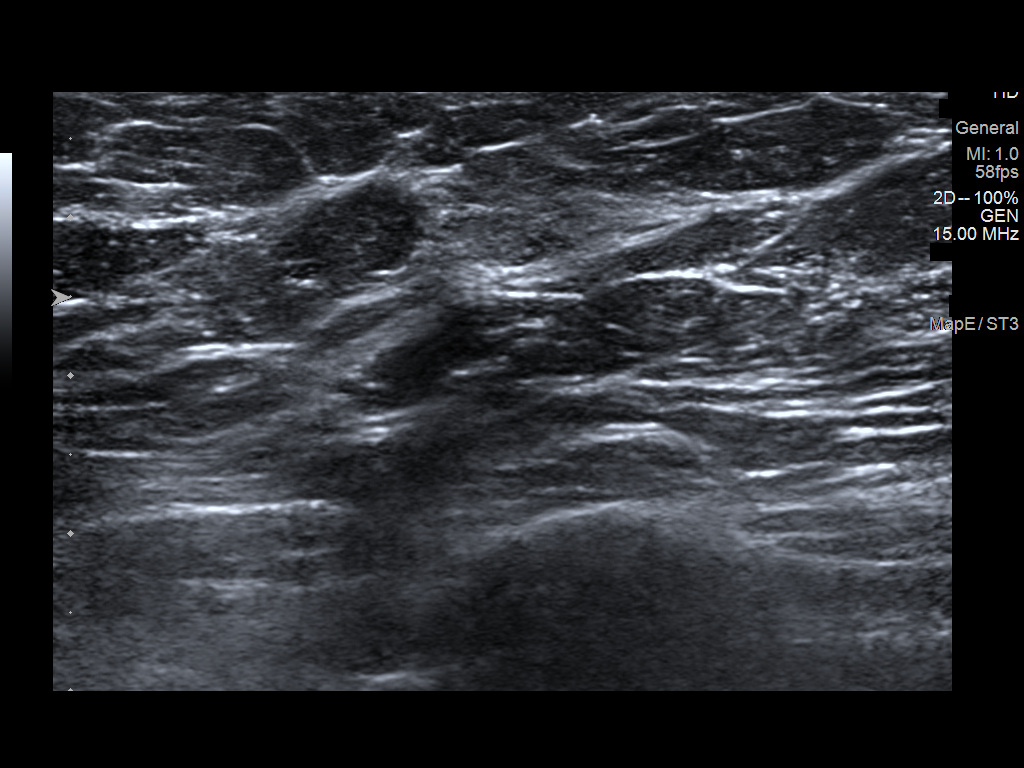
[im 2/5]
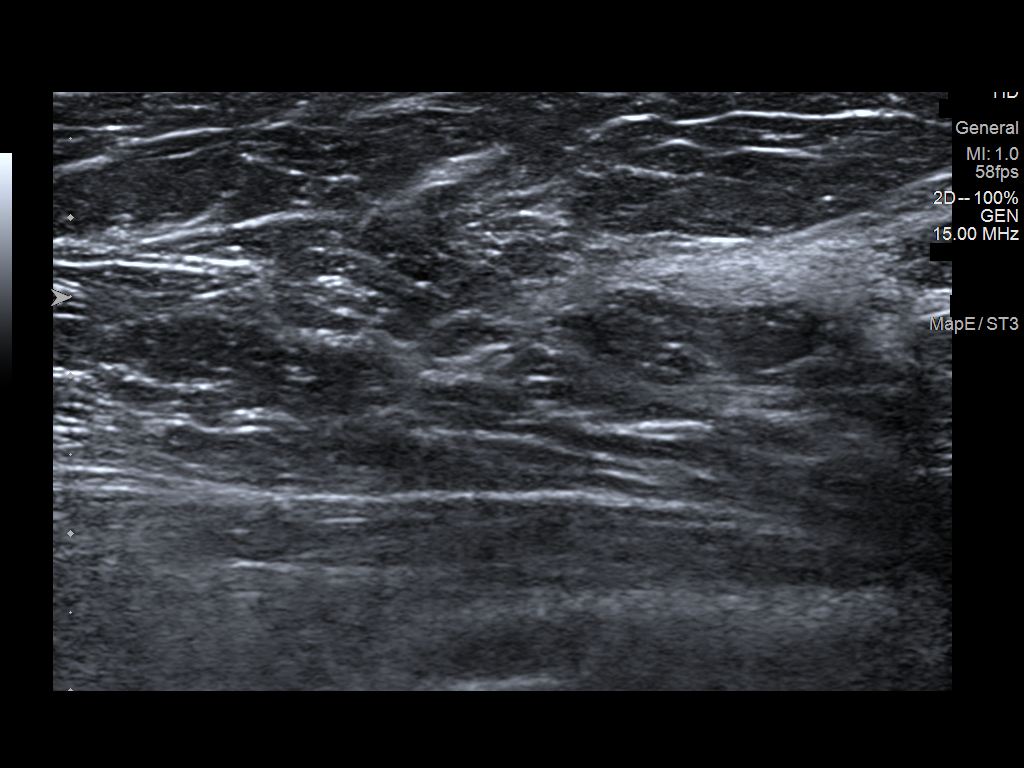
[im 3/5]
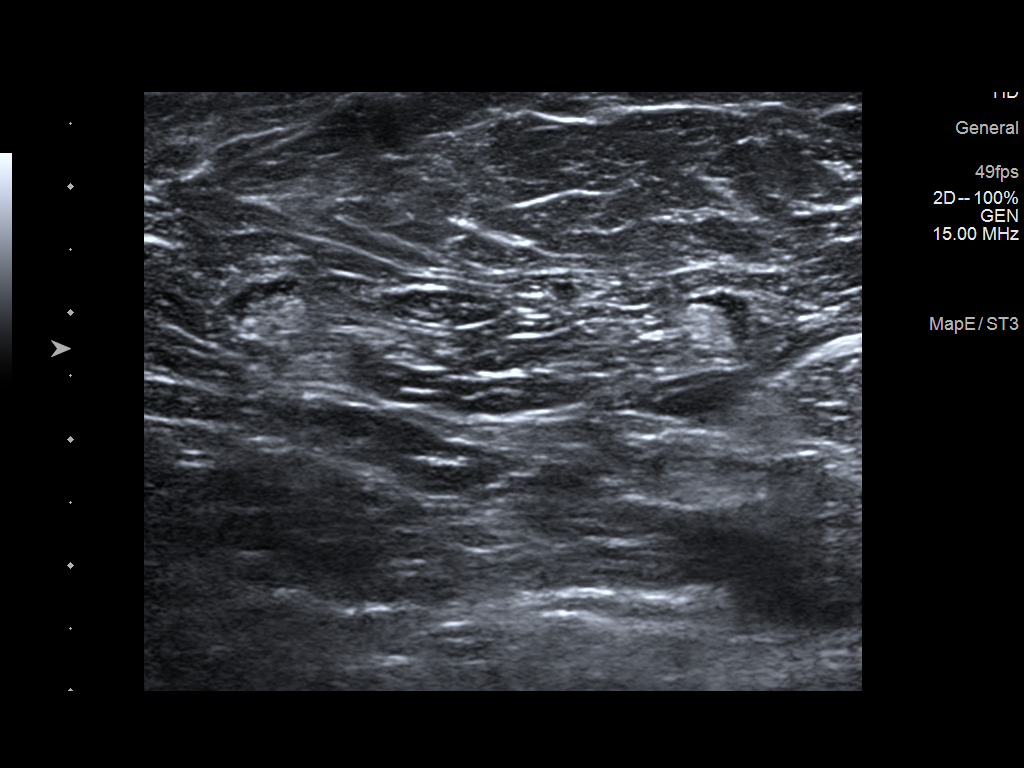
[im 4/5]
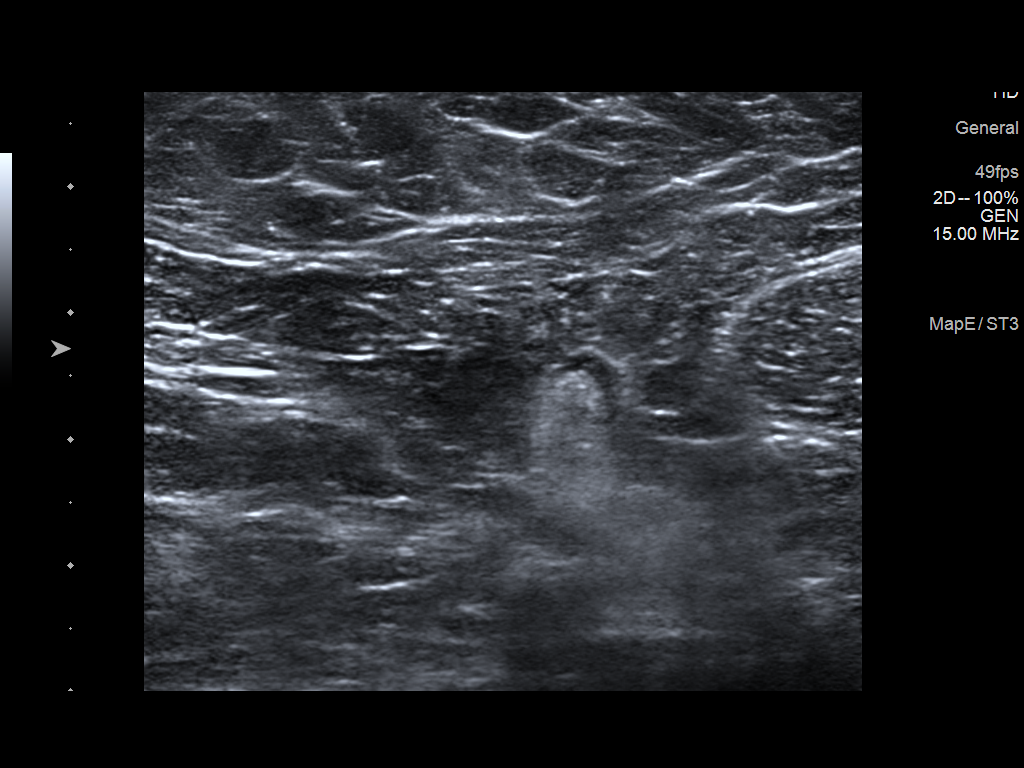
[im 5/5]
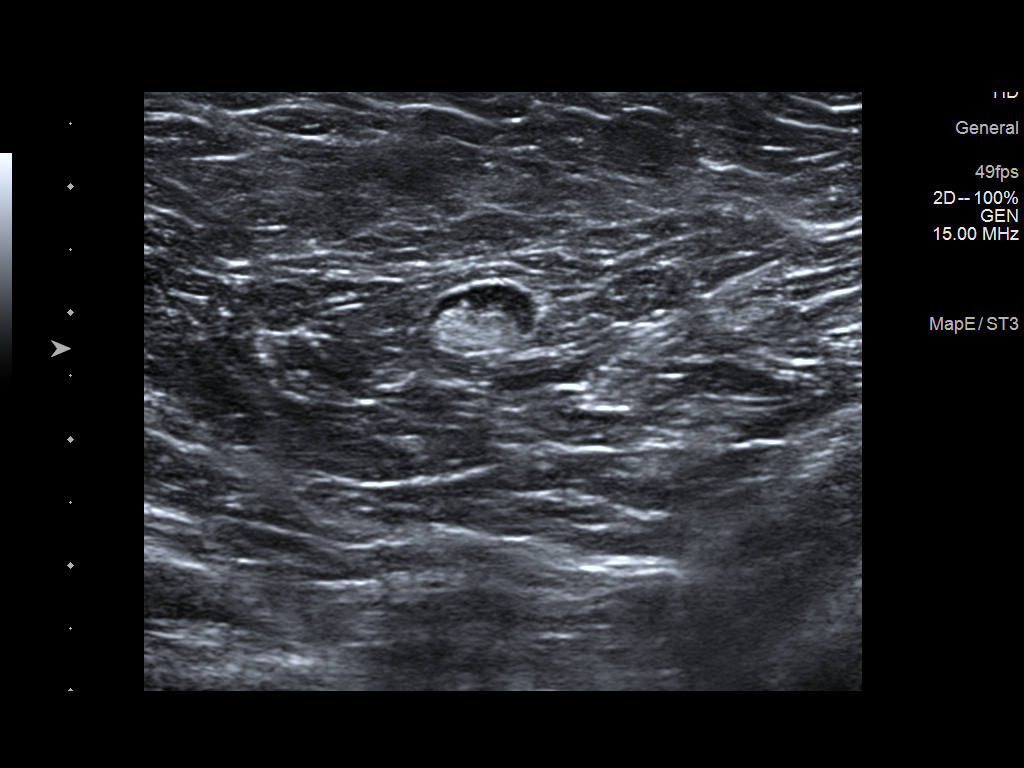

[5 of 5 positions shown; findings below may reference images not displayed]

ACR Breast Density Category b: There are scattered areas of
fibroglandular density.
FINDINGS: Tomosynthesis and synthesized full field CC and MLO views of the
RIGHT breast were obtained. Tomosynthesis and synthesized spot
compression tangential view of the areas of concern in the RIGHT
breast and low RIGHT axilla were also obtained.

No mammographic abnormality in the area of concern in the OUTER
breast at POSTERIOR depth. Surgical scar and normal scattered
fibroglandular tissue is present in this location.

No mammographic abnormality in the area of concern in the axillary
tail/low RIGHT axilla. Surgical scar and normal-appearing low RIGHT
axillary lymph nodes are present in this location.

No findings suspicious for malignancy in the RIGHT breast.

Mammographic images were processed with CAD.

On physical exam, there is a scar in the low RIGHT axilla related to
the prior resection of fatty tissue at the time of the reduction
mammoplasty. There is no palpable mass or lymphadenopathy in the
RIGHT axilla. The patient is tender to palpation in the OUTER RIGHT
breast, though I do not palpate a discrete mass.

Targeted RIGHT breast ultrasound is performed, showing normal
scattered fibroglandular tissue at the 8:30 o'clock position
approximately 10 cm from the nipple in the area of palpable concern.
No cyst, solid mass or abnormal acoustic shadowing is identified.

Targeted low RIGHT axillary ultrasound is performed, showing normal
lymph nodes and normal subcutaneous fat.
IMPRESSION: 1. No mammographic or sonographic evidence of malignancy involving
the RIGHT breast.
2. No pathologic RIGHT axillary lymphadenopathy.

RECOMMENDATION:
Annual BILATERAL screening mammography which is due in late July 2018.

I have discussed the findings and recommendations with the patient.
Results were also provided in writing at the conclusion of the
visit. If applicable, a reminder letter will be sent to the patient
regarding the next appointment.

BI-RADS CATEGORY  2: Benign.

## 2020-04-21 IMAGING — RF DG CHOLANGIOGRAM OPERATIVE
1 series · 5 of 5 positions shown · non-contrast
Comparison: None

CLINICAL DATA: Intraoperative cholangiogram during laparoscopic
cholecystectomy.

EXAM:
INTRAOPERATIVE CHOLANGIOGRAM
FLUOROSCOPY TIME:  5 seconds

[Series 1: unknown protocol · 0.20mm/px · 2 acquisitions, 5 frames shown]
[im 1/2]
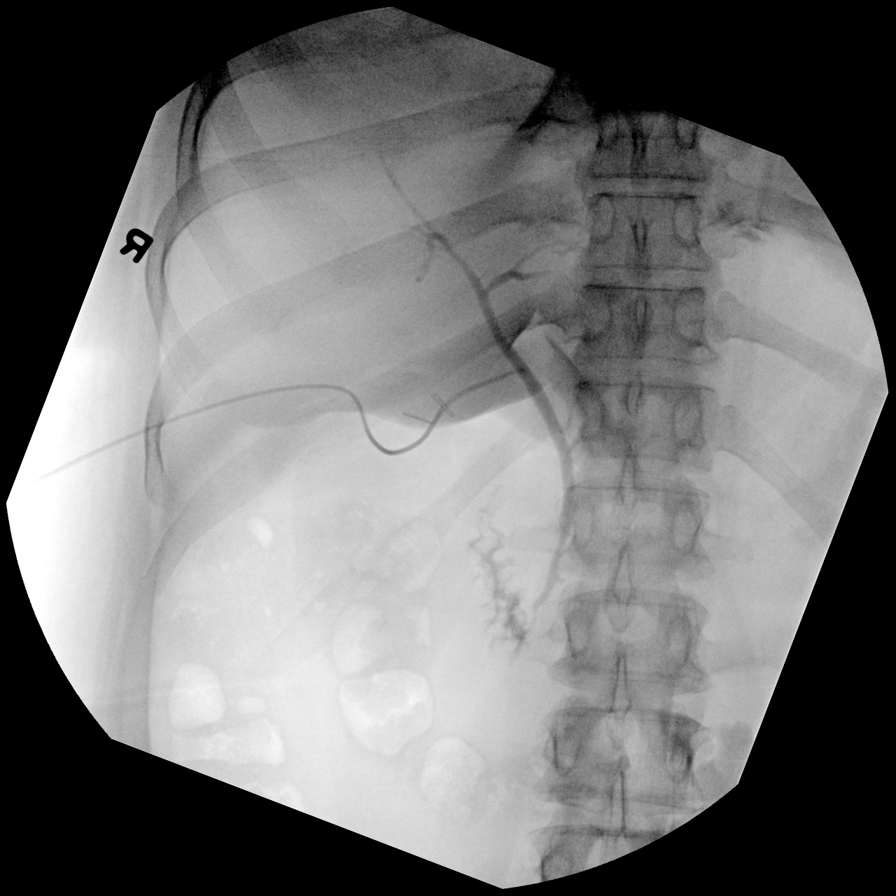
[im 1/2]
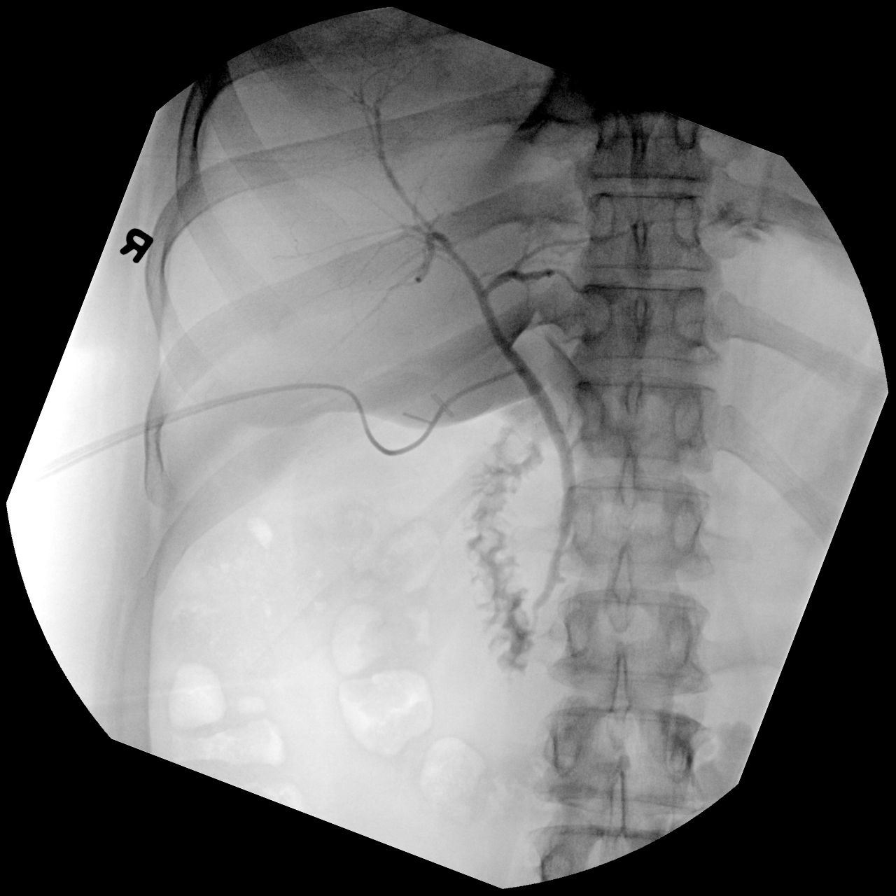
[im 1/2]
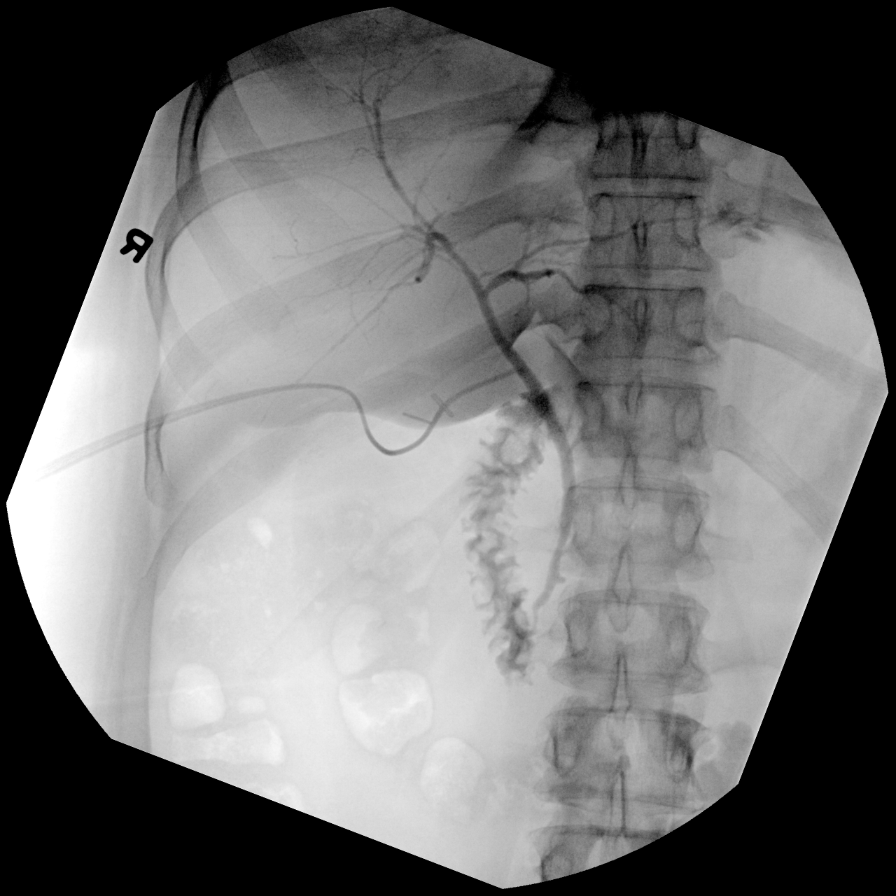
[im 1/2]
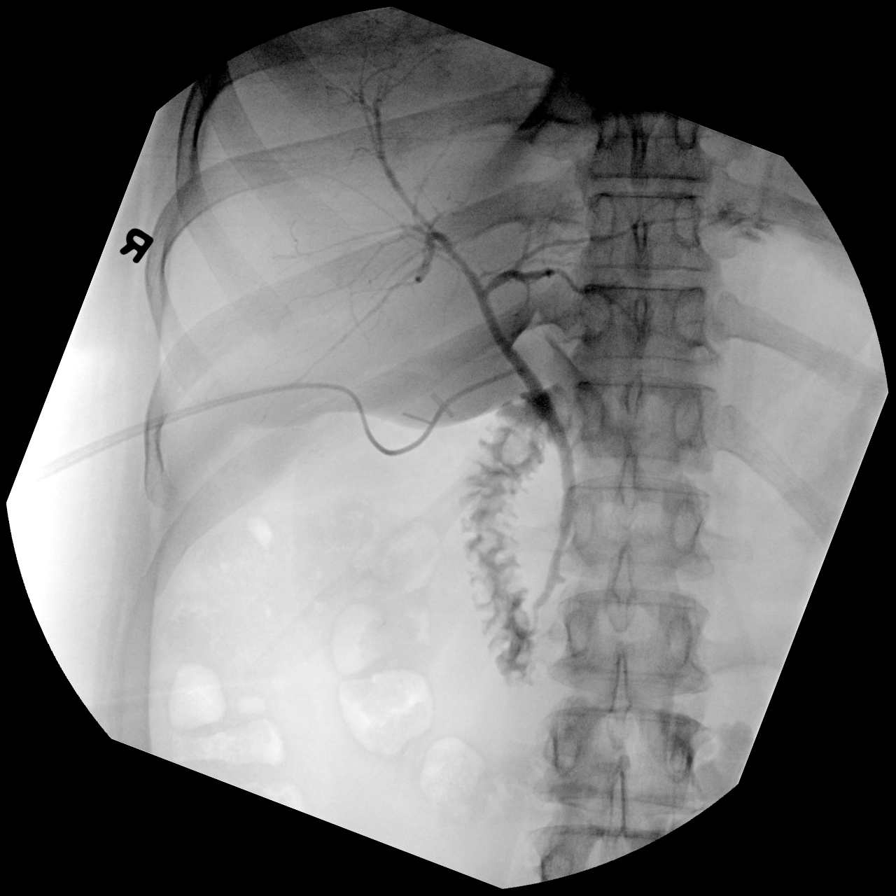
[im 2/2]
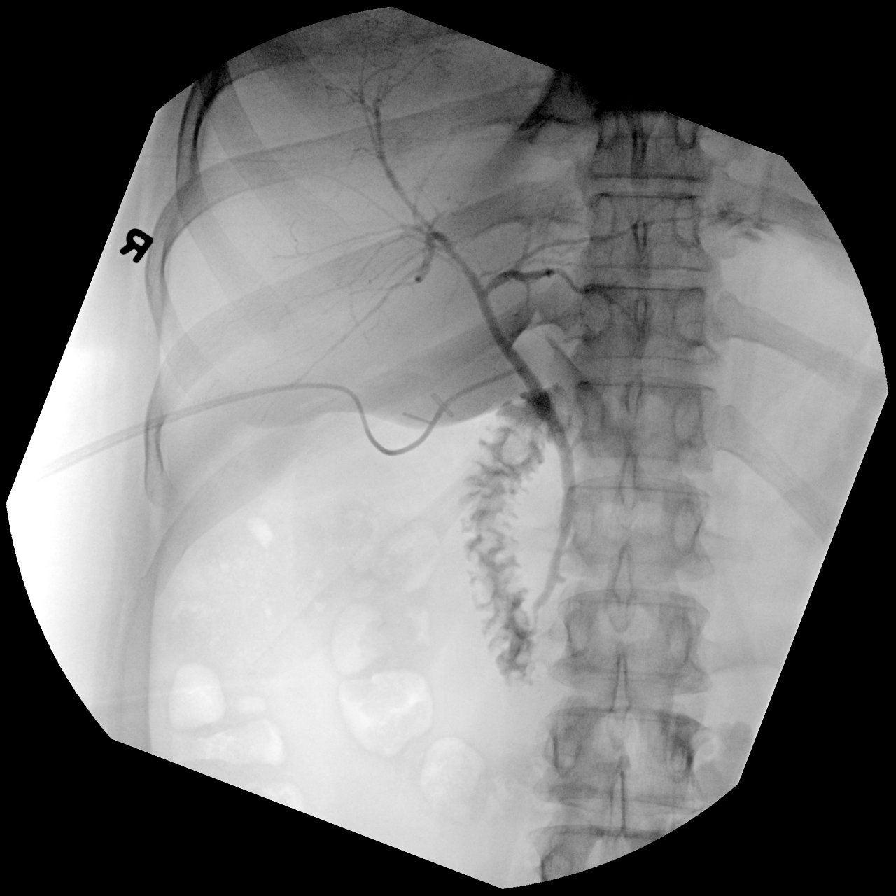

[5 of 5 positions shown; findings below may reference images not displayed]

FINDINGS: Intraoperative cholangiographic images of the right upper abdominal
quadrant during laparoscopic cholecystectomy are provided for
review.

Surgical clips overlie the expected location of the gallbladder
fossa.

Contrast injection demonstrates selective cannulation of the central
aspect of the cystic duct.

There is passage of contrast through the central aspect of the
cystic duct with filling of a non dilated common bile duct. There is
passage of contrast though the CBD and into the descending portion
of the duodenum.

There is minimal reflux of injected contrast into the common hepatic
duct and central aspect of the non dilated intrahepatic biliary
system.

There are no discrete filling defects within the opacified portions
of the biliary system to suggest the presence of
choledocholithiasis.
IMPRESSION: No evidence of choledocholithiasis.

## 2024-02-02 ENCOUNTER — Emergency Department (HOSPITAL_BASED_OUTPATIENT_CLINIC_OR_DEPARTMENT_OTHER): Admitting: Radiology

## 2024-02-02 ENCOUNTER — Other Ambulatory Visit: Payer: Self-pay

## 2024-02-02 ENCOUNTER — Emergency Department (HOSPITAL_BASED_OUTPATIENT_CLINIC_OR_DEPARTMENT_OTHER): Admission: EM | Admit: 2024-02-02 | Discharge: 2024-02-03 | Disposition: A | Discharge status: Home / Self Care

## 2024-02-02 ENCOUNTER — Emergency Department (HOSPITAL_BASED_OUTPATIENT_CLINIC_OR_DEPARTMENT_OTHER)

## 2024-02-02 DIAGNOSIS — B349 Viral infection, unspecified: Secondary | ICD-10-CM | POA: Insufficient documentation

## 2024-02-02 DIAGNOSIS — R519 Headache, unspecified: Secondary | ICD-10-CM | POA: Diagnosis present

## 2024-02-02 DIAGNOSIS — E871 Hypo-osmolality and hyponatremia: Secondary | ICD-10-CM | POA: Insufficient documentation

## 2024-02-02 DIAGNOSIS — E876 Hypokalemia: Secondary | ICD-10-CM | POA: Diagnosis not present

## 2024-02-02 DIAGNOSIS — R0789 Other chest pain: Secondary | ICD-10-CM

## 2024-02-02 LAB — CBC
HCT: 39.7 % (ref 36.0–46.0)
Hemoglobin: 13.1 g/dL (ref 12.0–15.0)
MCH: 26.6 pg (ref 26.0–34.0)
MCHC: 33 g/dL (ref 30.0–36.0)
MCV: 80.5 fL (ref 80.0–100.0)
Platelets: 319 K/uL (ref 150–400)
RBC: 4.93 MIL/uL (ref 3.87–5.11)
RDW: 14.6 % (ref 11.5–15.5)
WBC: 9 K/uL (ref 4.0–10.5)
nRBC: 0 % (ref 0.0–0.2)

## 2024-02-02 LAB — BASIC METABOLIC PANEL WITH GFR
Anion gap: 14 (ref 5–15)
BUN: 11 mg/dL (ref 6–20)
CO2: 26 mmol/L (ref 22–32)
Calcium: 9.6 mg/dL (ref 8.9–10.3)
Chloride: 92 mmol/L — ABNORMAL LOW (ref 98–111)
Creatinine, Ser: 0.79 mg/dL (ref 0.44–1.00)
GFR, Estimated: >60 mL/min (ref >60)
Glucose, Bld: 113 mg/dL — ABNORMAL HIGH (ref 70–99)
Potassium: 3.2 mmol/L — ABNORMAL LOW (ref 3.5–5.1)
Sodium: 132 mmol/L — ABNORMAL LOW (ref 135–145)

## 2024-02-02 LAB — TROPONIN T, HIGH SENSITIVITY: Troponin T High Sensitivity: <15 ng/L (ref 0–19)

## 2024-02-02 NOTE — ED Provider Notes (Signed)
 [place] EMERGENCY DEPARTMENT AT [facility] Provider Note   CSN: [id number] Arrival date & time: 02/02/24  1924     Patient presents with: Chest Pain   [name] is a 58 y.o. female.  {Add pertinent medical, surgical, social history, OB history to YEP:67052} Patient complaining of headache with dizziness and nausea onset on Saturday. Dizziness has been persistent, not dependent upon positioning. She reports chest discomfort that radiates into her left arm. Left arm is tingling, but not weak. Denies cough, congestion, fever.   Chest Pain      Prior to Admission medications   Medication Sig Start Date End Date Taking? Authorizing Provider  acetaminophen  (TYLENOL ) 500 MG tablet Take 500-1,000 mg by mouth every 6 (six) hours as needed for moderate pain.    [provider]  ALPRAZolam (XANAX) 0.5 MG tablet Take 0.25-0.5 mg by mouth 2 (two) times daily as needed for anxiety.    [provider]  cetirizine (ZYRTEC) 10 MG tablet Take 10 mg by mouth daily as needed for allergies.     [provider]  Cholecalciferol (VITAMIN D) 50 MCG (2000 UT) CAPS Take 2,000 Units by mouth daily.    [provider]  levothyroxine  (SYNTHROID , LEVOTHROID) 75 MCG tablet Take 75 mcg by mouth daily before breakfast.     [provider]  Multiple Vitamins-Minerals (AIRBORNE PO) Take 1 tablet by mouth 2 (two) times a week.    [provider]  oxyCODONE -acetaminophen  (PERCOCET/ROXICET) 5-325 MG tablet Take 1-2 tablets by mouth every 6 (six) hours as needed for severe pain. 06/03/18   Sheldon Standing, MD  pantoprazole (PROTONIX) 40 MG tablet Take 40 mg by mouth daily.    [provider]    Allergies: Aspirin and Penicillins    Review of Systems  Cardiovascular:  Positive for chest pain.    Updated Vital Signs BP (!) 158/79   Pulse (!) 117   Temp 97.8 F (36.6 C)   Resp 18   Wt 93 kg   SpO2 98%   BMI 38.11 kg/m    Physical Exam  (all labs ordered are listed, but only abnormal results are displayed) Labs Reviewed  BASIC METABOLIC PANEL WITH GFR - Abnormal; Notable for the following components:      Result Value   Sodium 132 (*)    Potassium 3.2 (*)    Chloride 92 (*)    Glucose, Bld 113 (*)    All other components within normal limits  CBC  TROPONIN T, HIGH SENSITIVITY  TROPONIN T, HIGH SENSITIVITY    EKG: None  Radiology: DG Chest 2 View Result Date: 02/02/2024 CLINICAL DATA:  Dizziness, nausea, chest pain radiating to left arm EXAM: DG CHEST 2V COMPARISON:  None Available. FINDINGS: The heart size and mediastinal contours are within normal limits. Both lungs are clear. The visualized skeletal structures are unremarkable. IMPRESSION: No active cardiopulmonary disease. Electronically Signed   By: Ozell Daring M.D.   On: 02/02/2024 19:54    {Document cardiac monitor, telemetry assessment procedure when appropriate:32947} Procedures   Medications Ordered in the ED - No data to display    {Click here for ABCD2, HEART and other calculators REFRESH Note before signing:1}                              Medical Decision Making Amount and/or Complexity of Data Reviewed Labs: ordered. Radiology: ordered.   ***  {Document critical care  time when appropriate  Document review of labs and clinical decision tools ie CHADS2VASC2, etc  Document your independent review of radiology images and any outside records  Document your discussion with family members, caretakers and with consultants  Document social determinants of health affecting pt's care  Document your decision making why or why not admission, treatments were needed:32947:::1}   Final diagnoses:  None    ED Discharge Orders     None

## 2024-02-02 NOTE — ED Triage Notes (Signed)
 Dizzy, nauseated, CP-into left arm and shoulder blade. Saturday began. Denies cough, congestion.

## 2024-02-03 NOTE — Discharge Instructions (Addendum)
 Tylenol  and/or ibuprofen  for discomfort. Refer to the attached instructions

## 2024-05-07 ENCOUNTER — Encounter (HOSPITAL_COMMUNITY): Payer: Self-pay

## 2024-05-07 ENCOUNTER — Other Ambulatory Visit: Payer: Self-pay

## 2024-05-07 ENCOUNTER — Emergency Department (HOSPITAL_COMMUNITY)
Admission: EM | Admit: 2024-05-07 | Discharge: 2024-05-07 | Disposition: A | Discharge status: Home / Self Care | Attending: Emergency Medicine | Admitting: Emergency Medicine

## 2024-05-07 ENCOUNTER — Emergency Department (HOSPITAL_COMMUNITY)

## 2024-05-07 DIAGNOSIS — R079 Chest pain, unspecified: Secondary | ICD-10-CM | POA: Insufficient documentation

## 2024-05-07 DIAGNOSIS — R072 Precordial pain: Secondary | ICD-10-CM | POA: Diagnosis present

## 2024-05-07 LAB — BASIC METABOLIC PANEL WITH GFR
Anion gap: 12 (ref 5–15)
BUN: 8 mg/dL (ref 6–20)
CO2: 26 mmol/L (ref 22–32)
Calcium: 9.4 mg/dL (ref 8.9–10.3)
Chloride: 97 mmol/L — ABNORMAL LOW (ref 98–111)
Creatinine, Ser: 0.75 mg/dL (ref 0.44–1.00)
GFR, Estimated: >60 mL/min
Glucose, Bld: 107 mg/dL — ABNORMAL HIGH (ref 70–99)
Potassium: 3.5 mmol/L (ref 3.5–5.1)
Sodium: 135 mmol/L (ref 135–145)

## 2024-05-07 LAB — CBC
HCT: 41.7 % (ref 36.0–46.0)
Hemoglobin: 13.6 g/dL (ref 12.0–15.0)
MCH: 27 pg (ref 26.0–34.0)
MCHC: 32.6 g/dL (ref 30.0–36.0)
MCV: 82.7 fL (ref 80.0–100.0)
Platelets: 357 10*3/uL (ref 150–400)
RBC: 5.04 MIL/uL (ref 3.87–5.11)
RDW: 14.6 % (ref 11.5–15.5)
WBC: 9.5 10*3/uL (ref 4.0–10.5)
nRBC: 0 % (ref 0.0–0.2)

## 2024-05-07 LAB — TROPONIN T, HIGH SENSITIVITY: Troponin T High Sensitivity: 7 ng/L (ref 0–19)

## 2024-05-07 LAB — D-DIMER, QUANTITATIVE: D-Dimer, Quant: 0.44 ug{FEU}/mL (ref 0.00–0.50)

## 2024-05-07 NOTE — ED Provider Notes (Signed)
 " [place] EMERGENCY DEPARTMENT AT [facility] Provider Note   CSN: [id number] Arrival date & time: 05/07/24  1105     Patient presents with: Chest pain  [name] is a 59 y.o. female.  {Add pertinent medical, surgical, social history, OB history to HPI:32947} HPI   Patient has a history of thyroid  disease, prediabetes, nonalcoholic fatty liver disease.  Patient presents ED with complaints of chest pain.  Patient states has been having symptoms for the last couple of weeks.  It is in her mid sternum but also goes to her upper back and arm.  Patient feels a dull ache.  Patient also reports having sensation where she can feel her heartbeat.  She has not had any nausea or vomiting.  No shortness of breath.  No leg swelling..  Patient went to an urgent care and was instructed to come to the ED  Prior to Admission medications  Medication Sig Start Date End Date Taking? Authorizing Provider  acetaminophen  (TYLENOL ) 500 MG tablet Take 500-1,000 mg by mouth every 6 (six) hours as needed for moderate pain.    [provider]  ALPRAZolam (XANAX) 0.5 MG tablet Take 0.25-0.5 mg by mouth 2 (two) times daily as needed for anxiety.    [provider]  cetirizine (ZYRTEC) 10 MG tablet Take 10 mg by mouth daily as needed for allergies.     [provider]  Cholecalciferol (VITAMIN D) 50 MCG (2000 UT) CAPS Take 2,000 Units by mouth daily.    [provider]  levothyroxine  (SYNTHROID , LEVOTHROID) 75 MCG tablet Take 75 mcg by mouth daily before breakfast.     [provider]  Multiple Vitamins-Minerals (AIRBORNE PO) Take 1 tablet by mouth 2 (two) times a week.    [provider]  oxyCODONE -acetaminophen  (PERCOCET/ROXICET) 5-325 MG tablet Take 1-2 tablets by mouth every 6 (six) hours as needed for severe pain. 06/03/18   Sheldon Standing, MD  pantoprazole (PROTONIX) 40 MG tablet Take 40 mg by mouth daily.    [provider]     Allergies: Aspirin and Penicillins    Review of Systems  Updated Vital Signs BP (!) 151/83 (BP Location: Right Arm)   Pulse 94   Temp 98.2 F (36.8 C)   Resp 18   SpO2 98%   Physical Exam Vitals and nursing note reviewed.  Constitutional:      General: She is not in acute distress.    Appearance: She is well-developed.  HENT:     Head: Normocephalic and atraumatic.     Right Ear: External ear normal.     Left Ear: External ear normal.  Eyes:     General: No scleral icterus.       Right eye: No discharge.        Left eye: No discharge.     Conjunctiva/sclera: Conjunctivae normal.  Neck:     Trachea: No tracheal deviation.  Cardiovascular:     Rate and Rhythm: Normal rate and regular rhythm.  Pulmonary:     Effort: Pulmonary effort is normal. No respiratory distress.     Breath sounds: Normal breath sounds. No stridor. No wheezing or rales.  Abdominal:     General: Bowel sounds are normal. There is no distension.     Palpations: Abdomen is soft.     Tenderness: There is no abdominal tenderness. There is no guarding or rebound.  Musculoskeletal:        General: No tenderness or deformity.  Cervical back: Neck supple.     Right lower leg: No edema.     Left lower leg: No edema.  Skin:    General: Skin is warm and dry.     Findings: No rash.  Neurological:     General: No focal deficit present.     Mental Status: She is alert.     Cranial Nerves: No cranial nerve deficit, dysarthria or facial asymmetry.     Sensory: No sensory deficit.     Motor: No abnormal muscle tone or seizure activity.     Coordination: Coordination normal.  Psychiatric:        Mood and Affect: Mood normal.     (all labs ordered are listed, but only abnormal results are displayed) Labs Reviewed  BASIC METABOLIC PANEL WITH GFR  CBC  TROPONIN T, HIGH SENSITIVITY    EKG: EKG Interpretation Date/Time:  Saturday May 07 2024 11:16:44 EST Ventricular Rate:  103 PR  Interval:  152 QRS Duration:  82 QT Interval:  374 QTC Calculation: 489 R Axis:   27  Text Interpretation: Sinus tachycardia Possible Anterior infarct , age undetermined Abnormal ECG When compared with ECG of 31-May-2018 09:45, Since last tracing rate faster Confirmed by Randol Simmonds [phone]) on 05/07/2024 11:38:01 AM  Radiology: No results found.  {Document cardiac monitor, telemetry assessment procedure when appropriate:32947} Procedures   Medications Ordered in the ED - No data to display    {Click here for ABCD2, HEART and other calculators REFRESH Note before signing:1}                              Medical Decision Making  ***  {Document critical care time when appropriate  Document review of labs and clinical decision tools ie CHADS2VASC2, etc  Document your independent review of radiology images and any outside records  Document your discussion with family members, caretakers and with consultants  Document social determinants of health affecting pt's care  Document your decision making why or why not admission, treatments were needed:32947:::1}   Final diagnoses:  None    ED Discharge Orders     None        "

## 2024-05-07 NOTE — Discharge Instructions (Signed)
 Take over-the-counter medications as needed for pain and discomfort.  Follow-up with your primary care doctor to be rechecked.  I have also placed a consult to cardiology.  They should call you within the next couple of business days to schedule an appointment

## 2024-05-07 NOTE — ED Provider Triage Note (Signed)
 Emergency Medicine Provider Triage Evaluation Note  [name] , a 59 y.o. female  was evaluated in triage.  Pt complains of chest pain, intermittently for the last 2 weeks, often present upon waking, better with belching, worse after eating.  She does endorse some shortness of breath especially with climbing stairs.  History of hypertension, no hyperlipidemia, no diabetes, no tobacco use.  Review of Systems  Positive: Chest pain, shortness of breath Negative:   Physical Exam  BP (!) 151/83 (BP Location: Right Arm)   Pulse 94   Temp 98.2 F (36.8 C)   Resp 18   SpO2 98%  Gen:   Awake, no distress   Resp:  Normal effort  MSK:   Moves extremities without difficulty  Other:    Medical Decision Making  Medically screening exam initiated at 11:21 AM.  Appropriate orders placed.  [name] was informed that the remainder of the evaluation will be completed by another provider, this initial triage assessment does not replace that evaluation, and the importance of remaining in the ED until their evaluation is complete.  Workup initiated in triage    Rosan Sherlean DEL, [place] 05/07/24 1126

## 2024-05-07 NOTE — ED Triage Notes (Signed)
 Pt c/o midsternal chest pain that radiates to mid upper back and feels a dull ache in left upper armx2wks. Pt denies N/V, SOB.

## 2024-05-18 ENCOUNTER — Ambulatory Visit: Admitting: Internal Medicine
